# Patient Record
Sex: Female | Born: 1990 | Race: Black or African American | Hispanic: No | Marital: Single | Smoking: Never smoker
Health system: Southern US, Community
[De-identification: ages and names within clinical notes are randomized; demographics above are authoritative.]

## PROBLEM LIST (undated history)

## (undated) ENCOUNTER — Emergency Department (HOSPITAL_COMMUNITY): Discharge status: Against Medical Advice | Payer: PRIVATE HEALTH INSURANCE

## (undated) DIAGNOSIS — M419 Scoliosis, unspecified: Secondary | ICD-10-CM

## (undated) DIAGNOSIS — H409 Unspecified glaucoma: Secondary | ICD-10-CM

## (undated) DIAGNOSIS — N83209 Unspecified ovarian cyst, unspecified side: Secondary | ICD-10-CM

## (undated) DIAGNOSIS — D573 Sickle-cell trait: Secondary | ICD-10-CM

## (undated) DIAGNOSIS — I341 Nonrheumatic mitral (valve) prolapse: Secondary | ICD-10-CM

---
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
## 2011-04-14 ENCOUNTER — Emergency Department (HOSPITAL_COMMUNITY)
Admission: EM | Admit: 2011-04-14 | Discharge: 2011-04-15 | Disposition: A | Discharge status: Home / Self Care | Payer: PRIVATE HEALTH INSURANCE | Attending: Emergency Medicine | Admitting: Emergency Medicine

## 2011-04-14 ENCOUNTER — Encounter (HOSPITAL_COMMUNITY): Payer: Self-pay | Admitting: Emergency Medicine

## 2011-04-14 DIAGNOSIS — D573 Sickle-cell trait: Secondary | ICD-10-CM | POA: Insufficient documentation

## 2011-04-14 DIAGNOSIS — N949 Unspecified condition associated with female genital organs and menstrual cycle: Secondary | ICD-10-CM | POA: Insufficient documentation

## 2011-04-14 DIAGNOSIS — R102 Pelvic and perineal pain: Secondary | ICD-10-CM

## 2011-04-14 DIAGNOSIS — R109 Unspecified abdominal pain: Secondary | ICD-10-CM | POA: Insufficient documentation

## 2011-04-14 HISTORY — DX: Sickle-cell trait: D57.3

## 2011-04-14 HISTORY — DX: Scoliosis, unspecified: M41.9

## 2011-04-14 HISTORY — DX: Nonrheumatic mitral (valve) prolapse: I34.1

## 2011-04-14 NOTE — ED Notes (Signed)
Pt states this morning she started having pain in her right lower quadrant that is sharp in nature  Pt states she has a hx of ovarian cysts  Pt is c/o nausea without vomiting

## 2011-04-15 ENCOUNTER — Emergency Department (HOSPITAL_COMMUNITY): Payer: PRIVATE HEALTH INSURANCE

## 2011-04-15 LAB — CBC
HCT: 36.7 % (ref 36.0–46.0)
MCHC: 33.8 g/dL (ref 30.0–36.0)
MCV: 83.2 fL (ref 78.0–100.0)
Platelets: 319 10*3/uL (ref 150–400)
RDW: 13.7 % (ref 11.5–15.5)
WBC: 8.3 10*3/uL (ref 4.0–10.5)

## 2011-04-15 LAB — POCT I-STAT, CHEM 8
Calcium, Ion: 1.24 mmol/L (ref 1.12–1.32)
HCT: 40 % (ref 36.0–46.0)
Hemoglobin: 13.6 g/dL (ref 12.0–15.0)
Sodium: 141 mEq/L (ref 135–145)
TCO2: 25 mmol/L (ref 0–100)

## 2011-04-15 LAB — URINALYSIS, ROUTINE W REFLEX MICROSCOPIC
Glucose, UA: NEGATIVE mg/dL
Hgb urine dipstick: NEGATIVE
Ketones, ur: NEGATIVE mg/dL
Protein, ur: NEGATIVE mg/dL
Urobilinogen, UA: 0.2 mg/dL (ref 0.0–1.0)

## 2011-04-15 LAB — DIFFERENTIAL
Basophils Absolute: 0 10*3/uL (ref 0.0–0.1)
Basophils Relative: 1 % (ref 0–1)
Eosinophils Absolute: 0.4 10*3/uL (ref 0.0–0.7)
Eosinophils Relative: 4 % (ref 0–5)
Monocytes Absolute: 0.4 10*3/uL (ref 0.1–1.0)

## 2011-04-15 MED ORDER — ONDANSETRON HCL 4 MG/2ML IJ SOLN
4.0000 mg | Freq: Once | INTRAMUSCULAR | Status: AC
  Administered 2011-04-15: 4 mg via INTRAVENOUS
  Filled 2011-04-15: qty 2

## 2011-04-15 MED ORDER — FENTANYL CITRATE 0.05 MG/ML IJ SOLN
50.0000 ug | Freq: Once | INTRAMUSCULAR | Status: AC
  Administered 2011-04-15: 50 ug via INTRAVENOUS
  Filled 2011-04-15: qty 2

## 2011-04-15 MED ORDER — HYDROCODONE-ACETAMINOPHEN 5-500 MG PO TABS: 2.0000 | ORAL_TABLET | Freq: Four times a day (QID) | ORAL | Status: AC | PRN

## 2011-04-15 NOTE — ED Notes (Signed)
Patient c/o intermittent RLQ ABD pain that began yesterday morning accompanied with nausea.

## 2011-04-15 NOTE — ED Notes (Signed)
Patient transported to Ultrasound 

## 2011-04-15 NOTE — ED Notes (Signed)
Pt. IV was taken out. Pt. Requested to have something to eat. Nurse was notified. Pt. Got sandwich/fruit cup, cup of water.

## 2011-04-15 NOTE — ED Provider Notes (Signed)
History     CSN: [id number]  Arrival date & time 04/14/11  1947   First MD Initiated Contact with Patient 04/15/11 0133      Chief Complaint  Patient presents with  . Abdominal Pain    (Consider location/radiation/quality/duration/timing/severity/associated sxs/prior treatment) Patient is a 21 y.o. female presenting with abdominal pain. The history is provided by the patient. No language interpreter was used.  Abdominal Pain The primary symptoms of the illness include abdominal pain. The primary symptoms of the illness do not include fever, fatigue, shortness of breath, nausea, vomiting, diarrhea, hematemesis, hematochezia, dysuria, vaginal discharge or vaginal bleeding. The current episode started 13 to 24 hours ago. The onset of the illness was sudden. The problem has not changed (episodic like previous ovarian cysts) since onset. The abdominal pain is located in the suprapubic region. The abdominal pain does not radiate. The severity of the abdominal pain is 9/10. The abdominal pain is relieved by nothing. Exacerbated by: nothing.  Associated with: none. The patient states that she believes she is currently pregnant. The patient has not had a change in bowel habit. Symptoms associated with the illness do not include chills, anorexia, diaphoresis, heartburn, constipation, urgency, hematuria, frequency or back pain. Significant associated medical issues do not include HIV.    Past Medical History  Diagnosis Date  . Mitral valve prolapse   . Scoliosis   . Sickle cell trait     History reviewed. No pertinent past surgical history.  Family History  Problem Relation Age of Onset  . Hypertension Mother   . Diabetes Other   . Hypertension Other   . Cancer Other     History  Substance Use Topics  . Smoking status: Never Smoker   . Smokeless tobacco: Not on file  . Alcohol Use: No    OB History    Grav Para Term Preterm Abortions TAB SAB Ect Mult Living                   Review of Systems  Constitutional: Negative for fever, chills, diaphoresis and fatigue.  HENT: Negative.   Eyes: Negative.   Respiratory: Negative for shortness of breath.   Cardiovascular: Negative.   Gastrointestinal: Positive for abdominal pain. Negative for heartburn, nausea, vomiting, diarrhea, constipation, hematochezia, anorexia and hematemesis.  Genitourinary: Positive for pelvic pain. Negative for dysuria, urgency, frequency, hematuria, vaginal bleeding and vaginal discharge.  Musculoskeletal: Negative for back pain.  Skin: Negative.   Neurological: Negative.   Hematological: Negative.   Psychiatric/Behavioral: Negative.     Allergies  Review of patient's allergies indicates no known allergies.  Home Medications   Current Outpatient Rx  Name Route Sig Dispense Refill  . ADULT MULTIVITAMIN W/MINERALS CH Oral Take 1 tablet by mouth daily.    Marland Kitchen VITAMIN C 500 MG PO TABS Oral Take 500 mg by mouth daily.      BP 126/90  Pulse 63  Temp(Src) 99.1 F (37.3 C) (Oral)  Resp 16  SpO2 100%  LMP 03/25/2011  Physical Exam  Constitutional: She is oriented to person, place, and time. She appears well-developed and well-nourished. No distress.  HENT:  Head: Normocephalic and atraumatic.  Eyes: Conjunctivae are normal. Pupils are equal, round, and reactive to light.  Neck: Normal range of motion. Neck supple.  Cardiovascular: Normal rate and regular rhythm.   Pulmonary/Chest: Effort normal and breath sounds normal.  Abdominal: Soft. Bowel sounds are normal. There is no tenderness. There is no rebound and no guarding.  Musculoskeletal: Normal range of motion.  Neurological: She is alert and oriented to person, place, and time.  Skin: Skin is warm and dry.  Psychiatric: Thought content normal.    ED Course  Procedures (including critical care time)   Labs Reviewed  URINALYSIS, ROUTINE W REFLEX MICROSCOPIC  CBC  DIFFERENTIAL  POCT I-STAT, CHEM 8   No results  found.   No diagnosis found.    MDM  No indication for CT scan no white count no left shift, like previous pain Return for worsening pain vomiting diarrhea or any concerns.  Patient verbalizes understanding and agrees to follow up       Wesleigh Markovic Smitty Cords, MD 04/15/11 [phone]

## 2011-04-15 NOTE — ED Notes (Addendum)
Error in charting.

## 2011-04-15 NOTE — ED Notes (Signed)
Received report on patient, she is alert and oriented skin W&D Pt was explained to her about what is going on and why she is still here and she verbalized understanding.

## 2011-04-23 ENCOUNTER — Emergency Department (HOSPITAL_COMMUNITY)
Admission: EM | Admit: 2011-04-23 | Discharge: 2011-04-23 | Disposition: A | Discharge status: Home / Self Care | Payer: PRIVATE HEALTH INSURANCE | Attending: Emergency Medicine | Admitting: Emergency Medicine

## 2011-04-23 ENCOUNTER — Encounter (HOSPITAL_COMMUNITY): Payer: Self-pay | Admitting: Emergency Medicine

## 2011-04-23 DIAGNOSIS — R10813 Right lower quadrant abdominal tenderness: Secondary | ICD-10-CM | POA: Insufficient documentation

## 2011-04-23 DIAGNOSIS — N949 Unspecified condition associated with female genital organs and menstrual cycle: Secondary | ICD-10-CM | POA: Insufficient documentation

## 2011-04-23 DIAGNOSIS — D573 Sickle-cell trait: Secondary | ICD-10-CM | POA: Insufficient documentation

## 2011-04-23 DIAGNOSIS — R102 Pelvic and perineal pain: Secondary | ICD-10-CM

## 2011-04-23 HISTORY — DX: Unspecified ovarian cyst, unspecified side: N83.209

## 2011-04-23 LAB — URINALYSIS, ROUTINE W REFLEX MICROSCOPIC
Glucose, UA: NEGATIVE mg/dL
Leukocytes, UA: NEGATIVE
Specific Gravity, Urine: 1.022 (ref 1.005–1.030)
pH: 5.5 (ref 5.0–8.0)

## 2011-04-23 MED ORDER — KETOROLAC TROMETHAMINE 60 MG/2ML IM SOLN
60.0000 mg | Freq: Once | INTRAMUSCULAR | Status: AC
  Administered 2011-04-23: 60 mg via INTRAMUSCULAR
  Filled 2011-04-23: qty 2

## 2011-04-23 MED ORDER — IPRATROPIUM BROMIDE 0.02 % IN SOLN
RESPIRATORY_TRACT | Status: AC
  Filled 2011-04-23: qty 2.5

## 2011-04-23 MED ORDER — ONDANSETRON 8 MG PO TBDP
8.0000 mg | ORAL_TABLET | Freq: Once | ORAL | Status: AC
  Administered 2011-04-23: 8 mg via ORAL
  Filled 2011-04-23: qty 1

## 2011-04-23 MED ORDER — ONDANSETRON HCL 4 MG PO TABS: 4.0000 mg | ORAL_TABLET | Freq: Four times a day (QID) | ORAL | Status: AC

## 2011-04-23 MED ORDER — TRAMADOL HCL 50 MG PO TABS
100.0000 mg | ORAL_TABLET | Freq: Once | ORAL | Status: AC
  Administered 2011-04-23: 100 mg via ORAL
  Filled 2011-04-23: qty 2

## 2011-04-23 NOTE — Discharge Instructions (Signed)
Drink plenty of fluids. Take your pain medicine you have at home. Use the  Zofran for nausea or vomiting. Consider getting a gynecologist in [place] for your continuing pelvic pain. Reach turned to the ER if you get fever, persistent vomiting, or the pain gets severe. Otherwise if you continue to have discomfort you can also go to [facility] to be evaluated. You would go to Maternity admissions to be seen.

## 2011-04-23 NOTE — ED Notes (Signed)
Rt lower quad abd pain with emesis x2, n/v/d, no fever , cough sice last week. Was seen last week for this same thing. Sore throat since last week, lmp 03/28/2011

## 2011-04-23 NOTE — ED Provider Notes (Signed)
History     CSN: [id number]  Arrival date & time 04/23/11  1549   First MD Initiated Contact with Patient 04/23/11 1552      Chief Complaint  Patient presents with  . Abdominal Pain  . Emesis    (Consider location/radiation/quality/duration/timing/severity/associated sxs/prior treatment) HPI  Patient relates she's been having pelvic pain off and on since 2011. She was seen by her GYN in [place] who diagnosed her ovarian cyst and told her she did not need to be concerned unless they were greater than 2 cm in size. She relates her last normal period was January 22 so her period was due yesterday. She relates about 3 AM she awakened with sharp right lower quadrant pain. She started having vomiting about 9:30 and vomited twice. She's also had diarrhea once. She denies fever. She states she's had a cough with rhinorrhea and sore throat for a week. She denies dysuria, frequency, or vaginal discharge. She states walking makes the pain feel worse and bending over makes it feel better. Patient was seen in the ER on 212 similar symptoms he had a ultrasound of pelvis done which was normal.  [facility] [place]  Past Medical History  Diagnosis Date  . Mitral valve prolapse   . Scoliosis   . Sickle cell trait   . Ovarian cyst     History reviewed. No pertinent past surgical history.  Family History  Problem Relation Age of Onset  . Hypertension Mother   . Diabetes Other   . Hypertension Other   . Cancer Other     History  Substance Use Topics  . Smoking status: Never Smoker   . Smokeless tobacco: Not on file  . Alcohol Use: No   college student Not sexually active  OB History    Grav Para Term Preterm Abortions TAB SAB Ect Mult Living                  Review of Systems  All other systems reviewed and are negative.    Allergies  Review of patient's allergies indicates no known allergies.  Home Medications   Current Outpatient Rx  Name Route Sig Dispense Refill  .  AZITHROMYCIN 250 MG PO TABS Oral Take 250 mg by mouth daily.    Marland Kitchen HYDROCODONE-ACETAMINOPHEN 5-500 MG PO TABS Oral Take 2 tablets by mouth every 6 (six) hours as needed for pain. 10 tablet 0  . ADULT MULTIVITAMIN W/MINERALS CH Oral Take 1 tablet by mouth daily.    Marland Kitchen VITAMIN C 500 MG PO TABS Oral Take 500 mg by mouth daily.      BP 106/67  Pulse 97  Temp 98 F (36.7 C)  Resp 20  SpO2 100%  LMP 03/25/2011  Vital signs normal    Physical Exam  Constitutional: She is oriented to person, place, and time. She appears well-developed and well-nourished.  Non-toxic appearance. She does not appear ill. No distress.  HENT:  Head: Normocephalic and atraumatic.  Right Ear: External ear normal.  Left Ear: External ear normal.  Nose: Nose normal. No mucosal edema or rhinorrhea.  Mouth/Throat: Oropharynx is clear and moist and mucous membranes are normal. No dental abscesses or uvula swelling.  Eyes: Conjunctivae and EOM are normal. Pupils are equal, round, and reactive to light.  Neck: Normal range of motion and full passive range of motion without pain. Neck supple.  Cardiovascular: Normal rate, regular rhythm and normal heart sounds.  Exam reveals no gallop and no friction  rub.   No murmur heard. Pulmonary/Chest: Effort normal and breath sounds normal. No respiratory distress. She has no wheezes. She has no rhonchi. She has no rales. She exhibits no tenderness and no crepitus.  Abdominal: Soft. Normal appearance and bowel sounds are normal. She exhibits no distension. There is tenderness. There is no rebound and no guarding.       Minimal tenderness in the right lower quadrant no guarding or rebound noted  Musculoskeletal: Normal range of motion. She exhibits no edema and no tenderness.       Moves all extremities well.   Neurological: She is alert and oriented to person, place, and time. She has normal strength. No cranial nerve deficit.  Skin: Skin is warm, dry and intact. No rash noted. No  erythema. No pallor.  Psychiatric: She has a normal mood and affect. Her speech is normal and behavior is normal. Her mood appears not anxious.    ED Course  Procedures (including critical care time)  Pt given zofran and her nausea is better. Pt also given toradol IM and states her pain isn't better. She is given tramadol orally and she has norco at home alreay from her prior ED visit.   [place] reported verified as normal on 2/12.   Diagnoses that have been ruled out:  None  Diagnoses that are still under consideration:  None  Final diagnoses:  Pelvic pain    1. Pelvic pain    New Prescriptions   ONDANSETRON (ZOFRAN) 4 MG TABLET    Take 1 tablet (4 mg total) by mouth every 6 (six) hours.   Plan discharge Devoria Albe, MD, FACEP    MDM          Ward Givens, MD 04/23/11 [phone]

## 2011-05-13 ENCOUNTER — Encounter (HOSPITAL_COMMUNITY): Payer: Self-pay | Admitting: *Deleted

## 2011-05-13 ENCOUNTER — Emergency Department (HOSPITAL_COMMUNITY): Payer: PRIVATE HEALTH INSURANCE

## 2011-05-13 ENCOUNTER — Emergency Department (HOSPITAL_COMMUNITY)
Admission: EM | Admit: 2011-05-13 | Discharge: 2011-05-13 | Disposition: A | Discharge status: Home / Self Care | Payer: PRIVATE HEALTH INSURANCE | Attending: Emergency Medicine | Admitting: Emergency Medicine

## 2011-05-13 DIAGNOSIS — IMO0002 Reserved for concepts with insufficient information to code with codable children: Secondary | ICD-10-CM | POA: Insufficient documentation

## 2011-05-13 DIAGNOSIS — W19XXXA Unspecified fall, initial encounter: Secondary | ICD-10-CM | POA: Insufficient documentation

## 2011-05-13 DIAGNOSIS — I059 Rheumatic mitral valve disease, unspecified: Secondary | ICD-10-CM | POA: Insufficient documentation

## 2011-05-13 DIAGNOSIS — S79911A Unspecified injury of right hip, initial encounter: Secondary | ICD-10-CM

## 2011-05-13 DIAGNOSIS — D573 Sickle-cell trait: Secondary | ICD-10-CM | POA: Insufficient documentation

## 2011-05-13 DIAGNOSIS — T148XXA Other injury of unspecified body region, initial encounter: Secondary | ICD-10-CM

## 2011-05-13 DIAGNOSIS — M4120 Other idiopathic scoliosis, site unspecified: Secondary | ICD-10-CM | POA: Insufficient documentation

## 2011-05-13 NOTE — ED Notes (Signed)
Pt in s/p fall on Saturday, c/o abrasion to right hip, no bleeding noted, pt ambulating without difficulty

## 2011-05-13 NOTE — Discharge Instructions (Signed)
Abrasions Abrasions are skin scrapes. Their treatment depends on how large and deep the abrasion is. Abrasions do not extend through all layers of the skin. A cut or lesion through all skin layers is called a laceration. HOME CARE INSTRUCTIONS   If you were given a dressing, change it at least once a day or as instructed by your caregiver. If the bandage sticks, soak it off with a solution of water or hydrogen peroxide.   Twice a day, wash the area with soap and water to remove all the cream/ointment. You may do this in a sink, under a tub faucet, or in a shower. Rinse off the soap and pat dry with a clean towel. Look for signs of infection (see below).   Reapply cream/ointment according to your caregiver's instruction. This will help prevent infection and keep the bandage from sticking. Telfa or gauze over the wound and under the dressing or wrap will also help keep the bandage from sticking.   If the bandage becomes wet, dirty, or develops a foul smell, change it as soon as possible.   Only take over-the-counter or prescription medicines for pain, discomfort, or fever as directed by your caregiver.  SEEK IMMEDIATE MEDICAL CARE IF:   Increasing pain in the wound.   Signs of infection develop: redness, swelling, surrounding area is tender to touch, or pus coming from the wound.   You have a fever.   Any foul smell coming from the wound or dressing.  Most skin wounds heal within ten days. Facial wounds heal faster. However, an infection may occur despite proper treatment. You should have the wound checked for signs of infection within 24 to 48 hours or sooner if problems arise. If you were not given a wound-check appointment, look closely at the wound yourself on the second day for early signs of infection listed above. MAKE SURE YOU:   Understand these instructions.   Will watch your condition.   Will get help right away if you are not doing well or get worse.  Document Released:  11/27/2004 Document Revised: 02/06/2011 Document Reviewed: 01/21/2011 [employer] Patient Information 2012 [employer]. 

## 2011-05-13 NOTE — ED Provider Notes (Signed)
History     CSN: [id number]  Arrival date & time 05/13/11  1750   First MD Initiated Contact with Patient 05/13/11 1901      Chief Complaint  Patient presents with  . Abrasion    RT hip  . Hip Pain    RT hip.  Fell on Saturday.  Pt is ambulatory.    (Consider location/radiation/quality/duration/timing/severity/associated sxs/prior treatment) HPI  21 year old female presents with a chief complaint of right hip pain. Patient states a few days ago she fell and landed on her right hip. She did hits her head but denies any loss of consciousness. She noticed abrasions to the right hip, right forearm, and left knee. For the past few days she noticed continuous pain to the right hip and greenish discharge from abrasion site. She is able to ambulate but states pain worse in. She denies bleeding. She request for x-ray.  Past Medical History  Diagnosis Date  . Mitral valve prolapse   . Scoliosis   . Sickle cell trait   . Ovarian cyst     History reviewed. No pertinent past surgical history.  Family History  Problem Relation Age of Onset  . Hypertension Mother   . Diabetes Other   . Hypertension Other   . Cancer Other     History  Substance Use Topics  . Smoking status: Never Smoker   . Smokeless tobacco: Not on file  . Alcohol Use: No    OB History    Grav Para Term Preterm Abortions TAB SAB Ect Mult Living                  Review of Systems  All other systems reviewed and are negative.    Allergies  Review of patient's allergies indicates no known allergies.  Home Medications   Current Outpatient Rx  Name Route Sig Dispense Refill  . ACETAMINOPHEN 325 MG PO TABS Oral Take 650 mg by mouth every 6 (six) hours as needed. Pain.    Marland Kitchen AZITHROMYCIN 250 MG PO TABS Oral Take 250 mg by mouth daily.    Marland Kitchen HYDROCODONE-ACETAMINOPHEN 5-325 MG PO TABS Oral Take 1 tablet by mouth every 6 (six) hours as needed. Pain.    . ADULT MULTIVITAMIN W/MINERALS CH Oral Take 1 tablet by  mouth daily.      BP 114/68  Pulse 77  Temp 98.2 F (36.8 C)  Resp 20  SpO2 100%  LMP 04/28/2011  Physical Exam  Nursing note and vitals reviewed. Constitutional: She appears well-nourished.  HENT:  Head: Normocephalic and atraumatic.  Eyes: Conjunctivae are normal.  Neck: Normal range of motion. Neck supple.  Musculoskeletal: Normal range of motion.  Neurological: She is alert.  Skin: Skin is warm.     Psychiatric: She has a normal mood and affect.    ED Course  Procedures (including critical care time)  Labs Reviewed - No data to display No results found.   Dg Hip Complete Right  05/13/2011  *RADIOLOGY REPORT*  Clinical Data: Fall  RIGHT HIP - COMPLETE 2+ VIEW  Comparison: None.  Findings: No acute fracture and no dislocation.  Unremarkable soft tissues.  IMPRESSION: No acute bony pathology.  Original Report Authenticated By: Donavan Burnet, M.D.       MDM  Abrasion noted to R hip, with no obvious sign of infection.  No hip deformity, able to ambulate without difficulty.  Pt request for xray, will obtain xray.     8:37 PM Xray of R  hip shows no acute fx or dislocation, reassurance given.  Bacitracin or neosporin for wound care.      Fayrene Helper, PA-C 05/13/11 2038

## 2011-05-14 NOTE — ED Provider Notes (Signed)
Medical screening examination/treatment/procedure(s) were performed by non-physician practitioner and as supervising physician I was immediately available for consultation/collaboration.  Cyndra Numbers, MD 05/14/11 [phone]

## 2013-01-10 ENCOUNTER — Encounter (HOSPITAL_COMMUNITY): Payer: Self-pay | Admitting: Emergency Medicine

## 2013-01-10 ENCOUNTER — Emergency Department (HOSPITAL_COMMUNITY)
Admission: EM | Admit: 2013-01-10 | Discharge: 2013-01-10 | Disposition: A | Discharge status: Home / Self Care | Payer: No Typology Code available for payment source | Attending: Emergency Medicine | Admitting: Emergency Medicine

## 2013-01-10 DIAGNOSIS — Z8679 Personal history of other diseases of the circulatory system: Secondary | ICD-10-CM | POA: Insufficient documentation

## 2013-01-10 DIAGNOSIS — H40819 Glaucoma with increased episcleral venous pressure, unspecified eye: Secondary | ICD-10-CM | POA: Insufficient documentation

## 2013-01-10 DIAGNOSIS — Z8739 Personal history of other diseases of the musculoskeletal system and connective tissue: Secondary | ICD-10-CM | POA: Insufficient documentation

## 2013-01-10 DIAGNOSIS — Z79899 Other long term (current) drug therapy: Secondary | ICD-10-CM | POA: Insufficient documentation

## 2013-01-10 DIAGNOSIS — Z8742 Personal history of other diseases of the female genital tract: Secondary | ICD-10-CM | POA: Insufficient documentation

## 2013-01-10 DIAGNOSIS — Z862 Personal history of diseases of the blood and blood-forming organs and certain disorders involving the immune mechanism: Secondary | ICD-10-CM | POA: Insufficient documentation

## 2013-01-10 DIAGNOSIS — R1110 Vomiting, unspecified: Secondary | ICD-10-CM | POA: Insufficient documentation

## 2013-01-10 DIAGNOSIS — R51 Headache: Secondary | ICD-10-CM | POA: Insufficient documentation

## 2013-01-10 DIAGNOSIS — H40051 Ocular hypertension, right eye: Secondary | ICD-10-CM

## 2013-01-10 HISTORY — DX: Unspecified glaucoma: H40.9

## 2013-01-10 MED ORDER — TETRACAINE HCL 0.5 % OP SOLN
1.0000 [drp] | Freq: Once | OPHTHALMIC | Status: AC
  Administered 2013-01-10: 1 [drp] via OPHTHALMIC
  Filled 2013-01-10: qty 2

## 2013-01-10 NOTE — ED Notes (Signed)
Pt c/o headache intermittently for 2 weeks. Pt has never been diagnosed with Migraines. Denies double vision. Pt c/o light sensitivity. Pt sts using her eye drops usually helps at first but then it fades. NAD noted, A&Ox4.

## 2013-01-10 NOTE — ED Provider Notes (Signed)
CSN: [id number]     Arrival date & time 01/10/13  1942 History   First MD Initiated Contact with Patient 01/10/13 2111   This chart was scribed for Antony Madura PA-C, a non-physician practitioner working with Benny Lennert, MD by Lewanda Rife, ED Scribe. This patient was seen in room WTR7/WTR7 and the patient's care was started at 9:56 PM    Chief Complaint  Patient presents with  . Migraine   (Consider location/radiation/quality/duration/timing/severity/associated sxs/prior Treatment) The history is provided by the patient. No language interpreter was used.   HPI Comments: Mallory Dickerson is a 22 y.o. female who presents to the Emergency Department with PMHx of Glaucoma complaining of waxing and waning right temporal and occipital headaches onset 7 days. Describes pain as sharp and squeezing. Denies headache at this time. Reports associated 1 non-bloody emesis. Denies associated nausea, numbness, visual disturbances, fever, vision loss, difficulty speaking or swallowing, and weakness. Reports headaches are exacerbated by sunlight and when she forgets to use her eye drops. headaches are usually alleviated when using eye drops.  States she followed up with eye doctor in [place] October this year. States she uses latanoprost for Glaucoma.    Past Medical History  Diagnosis Date  . Mitral valve prolapse   . Scoliosis   . Sickle cell trait   . Ovarian cyst   . Glaucoma    History reviewed. No pertinent past surgical history. Family History  Problem Relation Age of Onset  . Hypertension Mother   . Diabetes Other   . Hypertension Other   . Cancer Other    History  Substance Use Topics  . Smoking status: Never Smoker   . Smokeless tobacco: Not on file  . Alcohol Use: Yes     Comment: occ   OB History   Grav Para Term Preterm Abortions TAB SAB Ect Mult Living                 Review of Systems  Eyes: Positive for pain. Negative for visual disturbance.  Neurological: Positive for  headaches.  All other systems reviewed and are negative.   A complete 10 system review of systems was obtained and all systems are negative except as noted in the HPI and PMHx.   Allergies  Review of patient's allergies indicates no known allergies.  Home Medications   Current Outpatient Rx  Name  Route  Sig  Dispense  Refill  . latanoprost (XALATAN) 0.005 % ophthalmic solution   Both Eyes   Place 1 drop into both eyes at bedtime.         . Multiple Vitamin (MULITIVITAMIN WITH MINERALS) TABS   Oral   Take 1 tablet by mouth daily.          BP 115/87  Pulse 69  Temp(Src) 98.6 F (37 C) (Oral)  Resp 16  Ht 5\' 3"  (1.6 m)  Wt 107 lb (48.535 kg)  BMI 18.96 kg/m2  SpO2 98%  LMP 12/26/2012  Physical Exam  Nursing note and vitals reviewed. Constitutional: She is oriented to person, place, and time. She appears well-developed and well-nourished. No distress.  HENT:  Head: Normocephalic and atraumatic.  Mouth/Throat: Oropharynx is clear and moist. No oropharyngeal exudate.  Eyes: Conjunctivae and EOM are normal. Pupils are equal, round, and reactive to light. Right eye exhibits no discharge. Left eye exhibits no discharge. No scleral icterus. Right eye exhibits no nystagmus. Left eye exhibits no nystagmus.  Fundoscopic exam:      The right  eye shows no hemorrhage and no papilledema. The right eye shows red reflex.       The left eye shows no hemorrhage and no papilledema. The left eye shows red reflex.  IOP 26 on right eye  IOP 20 on left eye  Normal cup-to-disc-ratio bilaterally   Neck: Normal range of motion. Neck supple.  Cardiovascular: Normal rate, regular rhythm, normal heart sounds and intact distal pulses.   No murmur heard. Pulmonary/Chest: Effort normal and breath sounds normal. No respiratory distress. She has no wheezes. She has no rales.  Musculoskeletal: Normal range of motion.  Neurological: She is alert and oriented to person, place, and time. GCS eye  subscore is 4. GCS verbal subscore is 5. GCS motor subscore is 6.  Patient speaks in full sentences. Cranial nerves III through XII grossly intact. Patient has equal grip strength bilaterally with 5/5 strength against resistance in her upper and lower extremities. DTRs normal and symmetric. Patient moves extremities without ataxia.  Skin: Skin is warm and dry. No rash noted. She is not diaphoretic. No erythema. No pallor.  Psychiatric: She has a normal mood and affect. Her behavior is normal.    ED Course  Procedures (including critical care time)  COORDINATION OF CARE:  Nursing notes reviewed. Vital signs reviewed. Initial pt interview and examination performed.   9:56 PM-Discussed work up plan with pt at bedside, which includes tono-pen. Pt agrees with plan to f/u with local optha.  Treatment plan initiated: Medications  tetracaine (PONTOCAINE) 0.5 % ophthalmic solution 1 drop (not administered)   Initial diagnostic testing ordered.    Labs Review Labs Reviewed - No data to display Imaging Review No results found.  EKG Interpretation   None       MDM   1. Increased intraocular pressure, right    22 year old female a history of glaucoma presents for a headache that has been intermittent over the past month. Patient associates timing of her headache with forgetting to use her latanoprost eye drops. Patient without headache on presentation to ED. She is afebrile, nontoxic appearing, and hemodynamically stable. No nuchal rigidity or meningeal signs. No focal neurologic deficits appreciated. Patient does have slightly increased intraocular pressure of her right eye, but she states she has not used her drops you today and that her pressures this high as 28 when she forgets to use her eye drops. No papilledema or intraocular hemorrhage appreciated bilaterally. Patient also denying acute vision changes with symptoms such as vision loss, blurry vision, and floaters.  Patient stable and  appropriate for discharge with ophthalmology followup for further evaluation of her symptoms. Return precautions discussed and patient agreeable to plan with no unaddressed concerns.  I personally performed the services described in this documentation, which was scribed in my presence. The recorded information has been reviewed and is accurate.     Antony Madura, PA-C 01/11/13 [phone]

## 2013-01-10 NOTE — ED Notes (Signed)
Pt states she has been having headaches a lot lately esp if she is in the sun a lot or if she is having to strain her eyes  Pt states she has glaucoma and is wondering if they are related to that  Pt states she had vomiting maybe on Saturday but none since  Pt states she has not taken any medication for her headache

## 2013-01-10 NOTE — ED Notes (Signed)
Pt states the pain comes and goes and she denies pain at this time

## 2013-01-12 NOTE — ED Provider Notes (Signed)
Medical screening examination/treatment/procedure(s) were performed by non-physician practitioner and as supervising physician I was immediately available for consultation/collaboration.  EKG Interpretation   None         Latima Hamza L Revin Corker, MD 01/12/13 0949 

## 2013-05-23 IMAGING — US US TRANSVAGINAL NON-OB
1 series · 14 of 25 positions shown · non-contrast
Comparison: None.

CLINICAL DATA: Abdominal pain.



[Series 1: us transvaginal non-ob · 0.30mm/px · 14 of 30 slices shown]
[im 1/30]
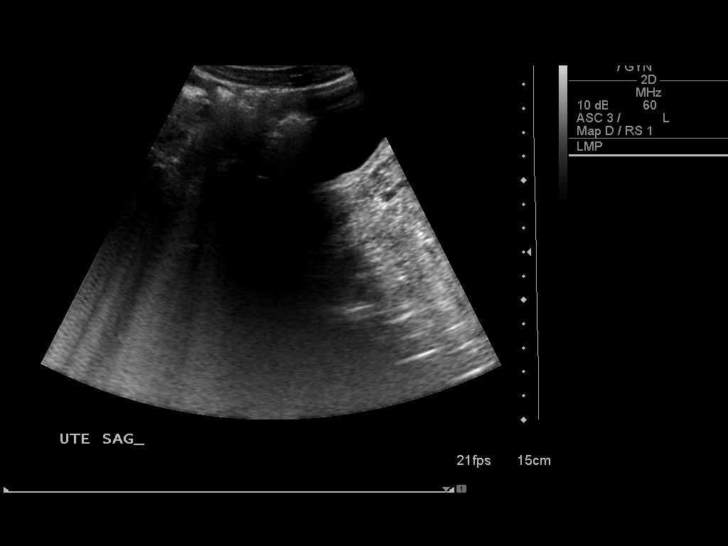
[im 3/30]
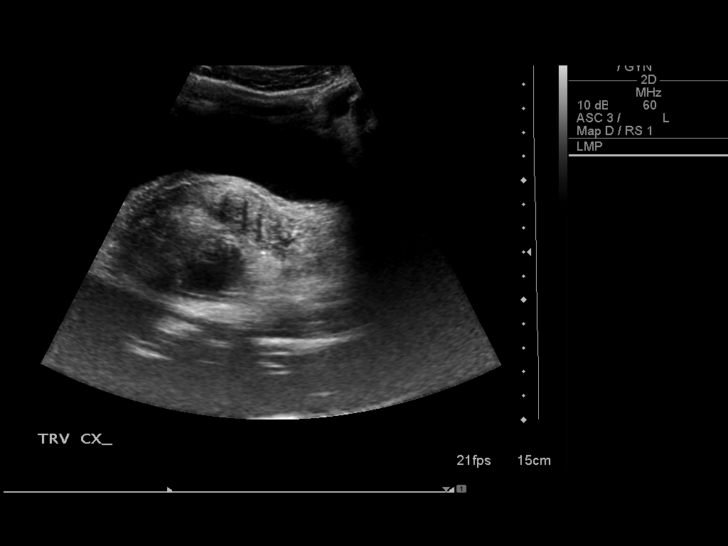
[im 5/30]
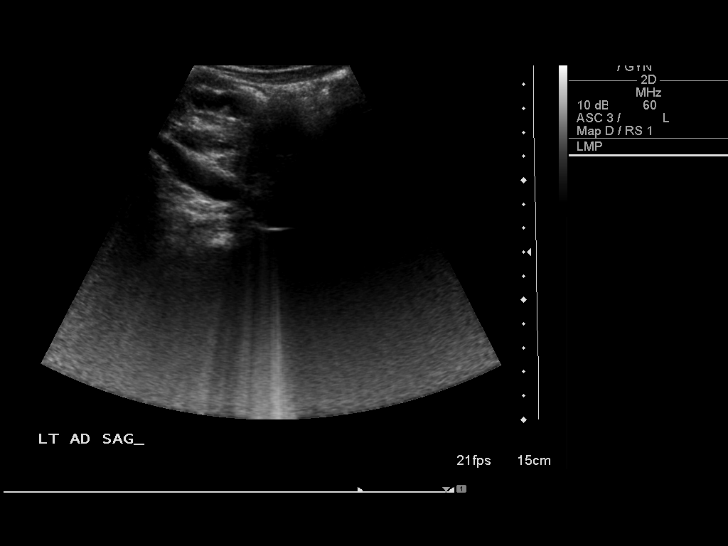
[im 8/30]
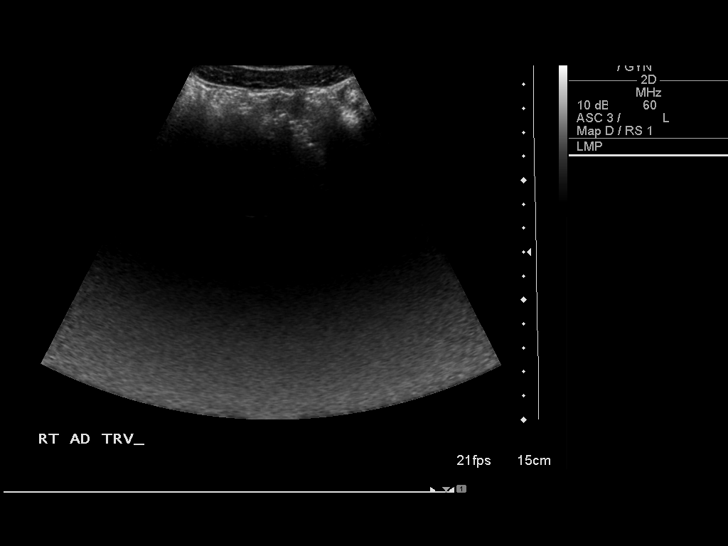
[im 10/30]
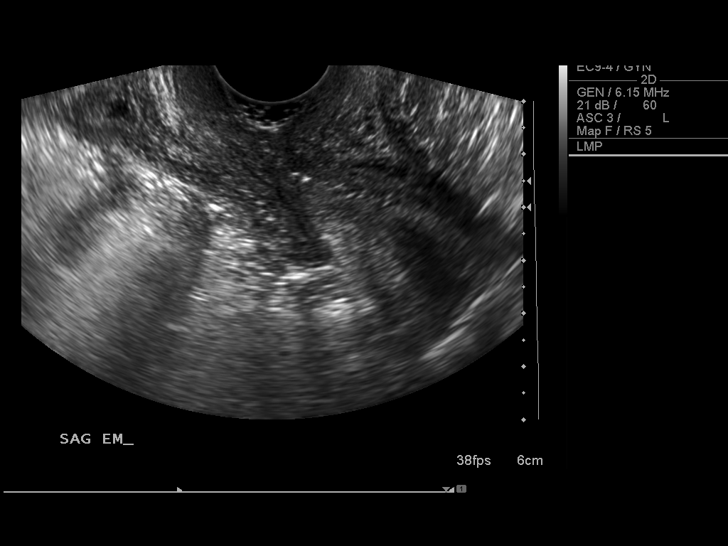
[im 11/30]
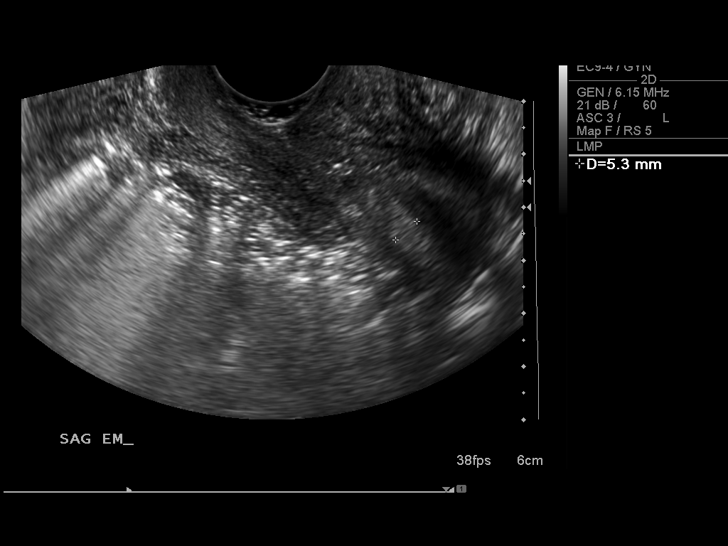
[im 14/30]
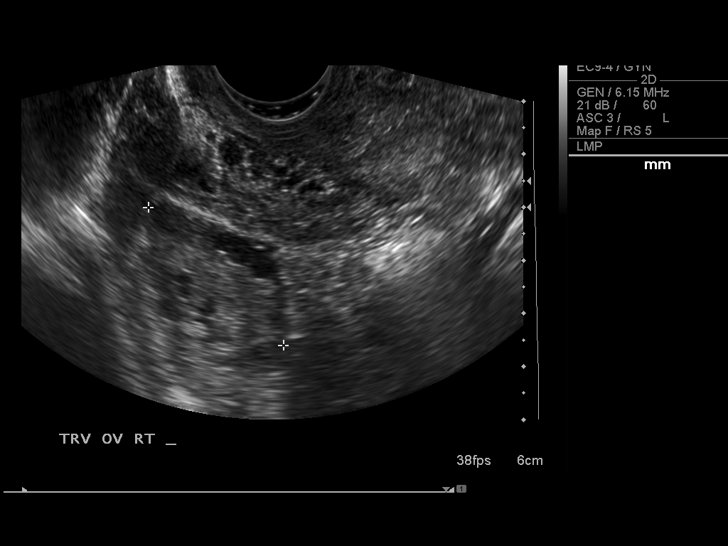
[im 16/30]
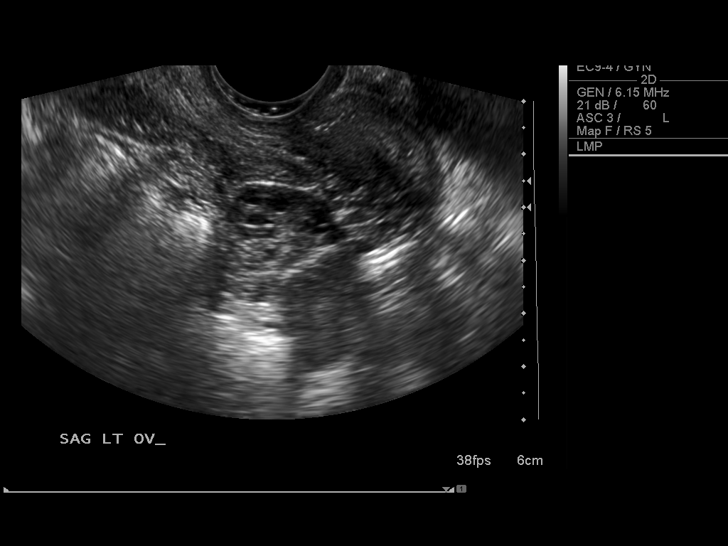
[im 19/30]
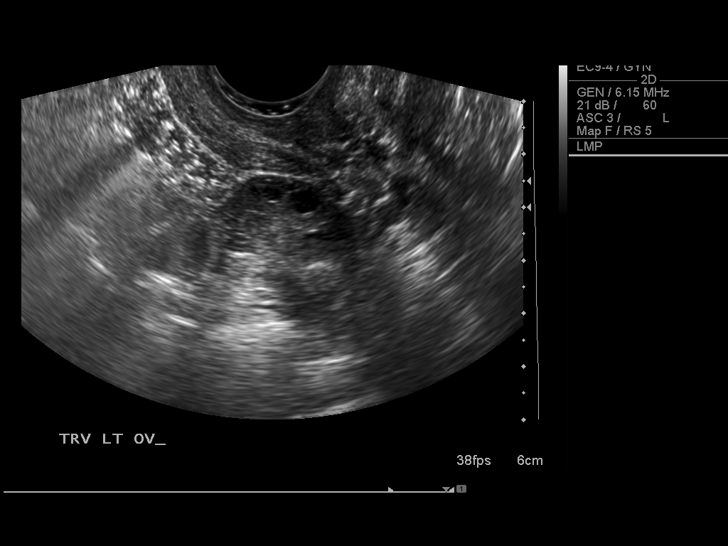
[im 20/30]
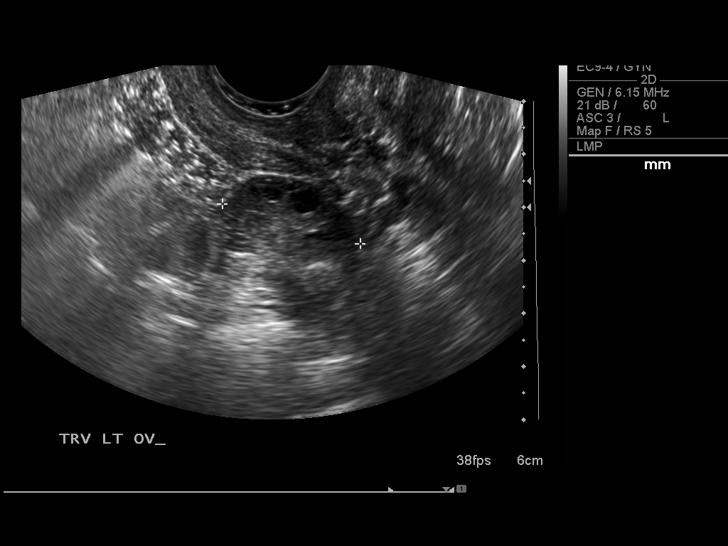
[im 22/30]
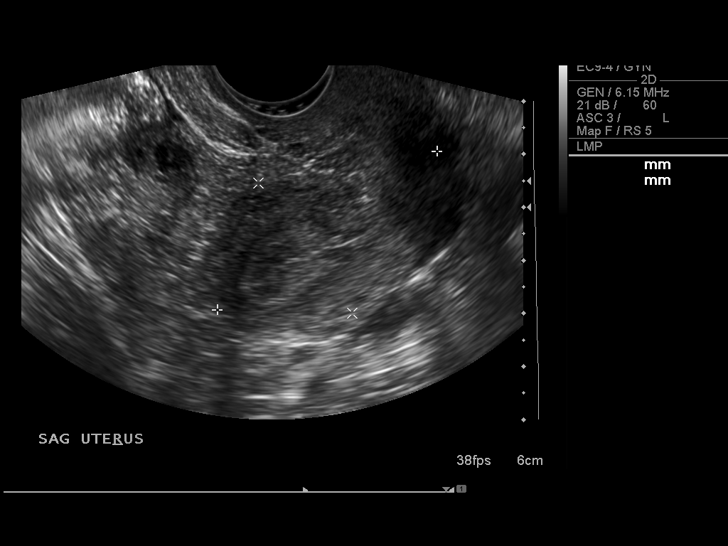
[im 25/30]
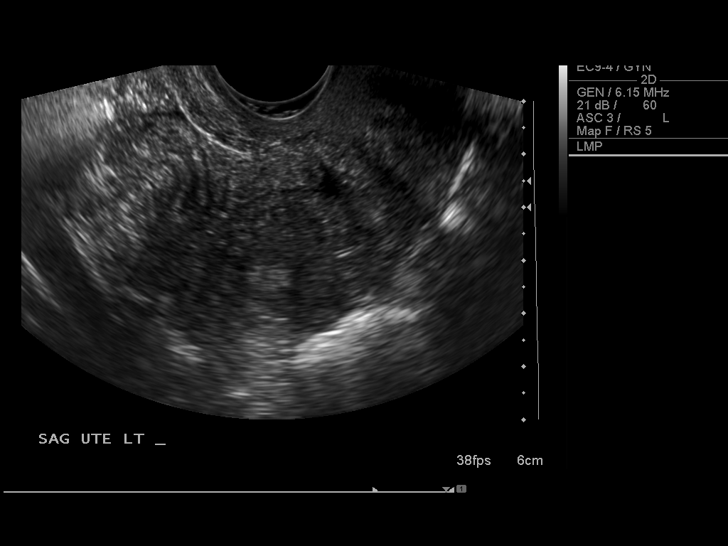
[im 27/30]
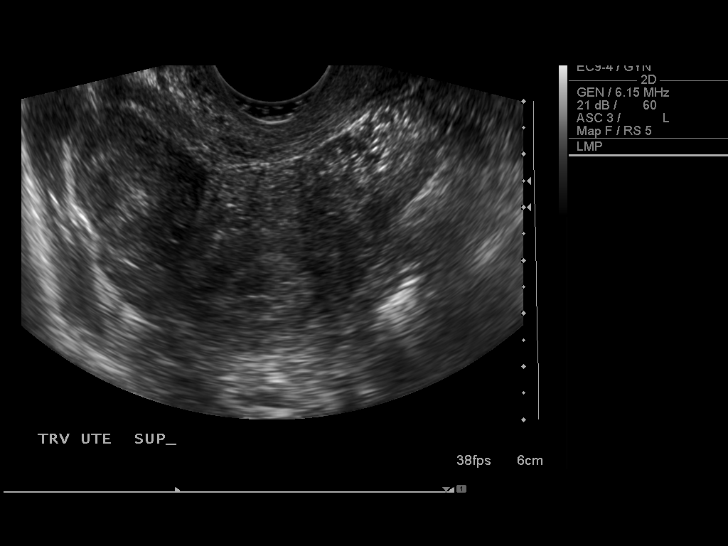
[im 30/30]
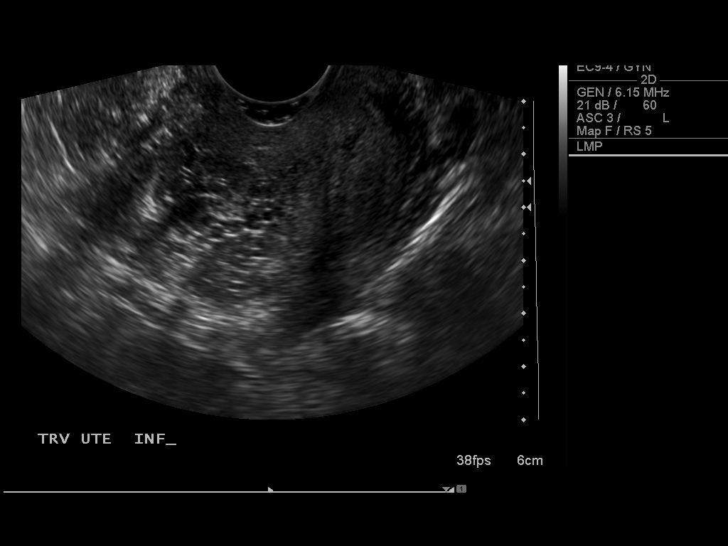

[14 of 25 positions shown; findings below may reference images not displayed]

FINDINGS: Uterus:  Normal in size and appearance measuring 5.1 x 3.0 x
cm.   No focal lesions.

Endometrium:  Normal in thickness and appearance measuring 12.7 mm.

Right ovary: Normal appearance/no adnexal mass. The ovary measures
3.0 x 2.6 x 3.6 cm.  Multiple small follicles.

Left ovary:   Normal appearance/no adnexal mass.  The ovary
measures 2.1 x 1.8-0.7 cm.  Multiple small follicles.

No free fluid the cul-de-sac.
IMPRESSION: Normal exam.  No evidence of pelvic mass or other significant
abnormality.

## 2013-09-08 ENCOUNTER — Encounter (HOSPITAL_COMMUNITY): Payer: Self-pay | Admitting: Emergency Medicine

## 2013-09-08 ENCOUNTER — Emergency Department (HOSPITAL_COMMUNITY)
Admission: EM | Admit: 2013-09-08 | Discharge: 2013-09-08 | Disposition: A | Discharge status: Home / Self Care | Payer: No Typology Code available for payment source | Attending: Emergency Medicine | Admitting: Emergency Medicine

## 2013-09-08 ENCOUNTER — Emergency Department (HOSPITAL_COMMUNITY): Payer: No Typology Code available for payment source

## 2013-09-08 DIAGNOSIS — Z8739 Personal history of other diseases of the musculoskeletal system and connective tissue: Secondary | ICD-10-CM | POA: Insufficient documentation

## 2013-09-08 DIAGNOSIS — H409 Unspecified glaucoma: Secondary | ICD-10-CM | POA: Insufficient documentation

## 2013-09-08 DIAGNOSIS — R0602 Shortness of breath: Secondary | ICD-10-CM | POA: Insufficient documentation

## 2013-09-08 DIAGNOSIS — Z862 Personal history of diseases of the blood and blood-forming organs and certain disorders involving the immune mechanism: Secondary | ICD-10-CM | POA: Insufficient documentation

## 2013-09-08 DIAGNOSIS — R072 Precordial pain: Secondary | ICD-10-CM

## 2013-09-08 DIAGNOSIS — R079 Chest pain, unspecified: Secondary | ICD-10-CM

## 2013-09-08 DIAGNOSIS — R0789 Other chest pain: Secondary | ICD-10-CM | POA: Insufficient documentation

## 2013-09-08 DIAGNOSIS — Z79899 Other long term (current) drug therapy: Secondary | ICD-10-CM | POA: Insufficient documentation

## 2013-09-08 DIAGNOSIS — Z8679 Personal history of other diseases of the circulatory system: Secondary | ICD-10-CM | POA: Insufficient documentation

## 2013-09-08 DIAGNOSIS — Z8742 Personal history of other diseases of the female genital tract: Secondary | ICD-10-CM | POA: Insufficient documentation

## 2013-09-08 NOTE — ED Notes (Signed)
Pt @ x-ray

## 2013-09-08 NOTE — Discharge Instructions (Signed)

## 2013-09-08 NOTE — ED Notes (Signed)
Patient c/o intermittent chest pain x 2 days. Patient states she has a history of mitral valve prolapse. Patient states she had SOB yesterday with chest pain none today.

## 2013-09-08 NOTE — ED Provider Notes (Signed)
CSN: [id number]     Arrival date & time 09/08/13  1432 History   First MD Initiated Contact with Patient 09/08/13 1503     Chief Complaint  Patient presents with  . Chest Pain     (Consider location/radiation/quality/duration/timing/severity/associated sxs/prior Treatment) Patient is a 23 y.o. female presenting with chest pain. The history is provided by the patient.  Chest Pain Pain location:  Substernal area Pain quality: tightness   Pain radiates to:  Neck Pain radiates to the back: no   Pain severity:  Moderate Onset quality:  Sudden Duration:  2 days Timing:  Intermittent Progression:  Improving Chronicity:  New Context: at rest   Relieved by:  Nothing Worsened by:  Nothing tried Associated symptoms: shortness of breath   Associated symptoms: no abdominal pain, no cough, no fever, no nausea and not vomiting     Past Medical History  Diagnosis Date  . Mitral valve prolapse   . Scoliosis   . Sickle cell trait   . Ovarian cyst   . Glaucoma    History reviewed. No pertinent past surgical history. Family History  Problem Relation Age of Onset  . Hypertension Mother   . Diabetes Other   . Hypertension Other   . Cancer Other    History  Substance Use Topics  . Smoking status: Never Smoker   . Smokeless tobacco: Not on file  . Alcohol Use: Yes     Comment: occ   OB History   Grav Para Term Preterm Abortions TAB SAB Ect Mult Living                 Review of Systems  Constitutional: Negative for fever.  Respiratory: Positive for shortness of breath. Negative for cough.   Cardiovascular: Positive for chest pain.  Gastrointestinal: Negative for nausea, vomiting and abdominal pain.  All other systems reviewed and are negative.     Allergies  Corn-containing products  Home Medications   Prior to Admission medications   Medication Sig Start Date End Date Taking? Authorizing Provider  latanoprost (XALATAN) 0.005 % ophthalmic solution Place 1 drop into  both eyes at bedtime.   Yes Historical Provider, MD  Multiple Vitamin (MULITIVITAMIN WITH MINERALS) TABS Take 1 tablet by mouth at bedtime.    Yes Historical Provider, MD   BP 120/82  Pulse 77  Temp(Src) 98.2 F (36.8 C) (Oral)  Resp 18  SpO2 100%  LMP 08/30/2013 Physical Exam  Nursing note and vitals reviewed. Constitutional: She is oriented to person, place, and time. She appears well-developed and well-nourished. No distress.  HENT:  Head: Normocephalic and atraumatic.  Eyes: EOM are normal. Pupils are equal, round, and reactive to light.  Neck: Normal range of motion. Neck supple.  Cardiovascular: Normal rate and regular rhythm.  Exam reveals no friction rub.   No murmur heard. Split S2  Pulmonary/Chest: Effort normal and breath sounds normal. No respiratory distress. She has no wheezes. She has no rales.  Abdominal: Soft. She exhibits no distension. There is no tenderness. There is no rebound.  Musculoskeletal: Normal range of motion. She exhibits no edema.  Neurological: She is alert and oriented to person, place, and time.  Skin: She is not diaphoretic.    ED Course  Procedures (including critical care time) Labs Review Labs Reviewed - No data to display  Imaging Review Dg Chest 2 View  09/08/2013   CLINICAL DATA:  Chest pain  EXAM: CHEST  2 VIEW  COMPARISON:  None.  FINDINGS: The  heart size and mediastinal contours are within normal limits. Both lungs are clear. Mild scoliosis.  IMPRESSION: No active cardiopulmonary disease.   Electronically Signed   By: Marlan Palau M.D.   On: 09/08/2013 15:31     EKG Interpretation   Date/Time:  Thursday September 08 2013 14:41:14 EDT Ventricular Rate:  81 PR Interval:  120 QRS Duration: 60 QT Interval:  359 QTC Calculation: 417 R Axis:   65 Text Interpretation:  Sinus rhythm No prior Confirmed by Gwendolyn Grant  MD, Jawanza Zambito  ([id number]) on 09/08/2013 3:05:35 PM      MDM   Final diagnoses:  Chest pain, unspecified chest pain type    29F  with hx of MVP presents with intermitent chest pani for the past few days. Described as tightness into her neck. Longest episode about 20 minutes last night with some mild SOB. Had an episode today that was 5-10 minutes. Has never been on meds for her MVP. Hx of CP with her MVP, however was different and many years ago. Followed by [facility] in [place], has not seen her Cardiologist in several years, she is here for school. Patient here with benign exam, no murmur, has split S2. No fevers, cough, N/V.  EKG ok. I spoke with Dr. Mayford Knife of [facility] who recommended Echo to look for pericardial effusion. Echo normal. I spoke with Dr. Excell Seltzer who was taking [facility] call who stated she can f/u with her Cardiologist in [place]. Given [facility] f/u numbers here if she needs them. Stable for discharge.  Dagmar Hait, MD 09/09/13 [phone]

## 2013-09-08 NOTE — ED Notes (Addendum)
Pt reports pain "a little" better at this time, reports feels pain when she touches her chest. Pt aware awaiting ECHO at this time.  NAD.

## 2013-09-08 NOTE — ED Notes (Signed)
ECHO at bedside.

## 2013-09-08 NOTE — ED Notes (Signed)
Initial Contact - pt resting on stretcher, reports central CP and pt also points to R upper chest when describing pain, x2 days.  Pt reports intermittent, with no noticeable alleviating factors.  Pt reports presents to ED because "it was lasting a long time".  Skin PWD.  Speaking full/clear sentences, rr even/un-lab.  Pt reports pain reproducible with inspiration.  Pt denies SOB.  A+Ox4.  MAEI.  NAD.

## 2013-09-08 NOTE — Progress Notes (Signed)
Echocardiogram 2D Echocardiogram has been performed.  Dorothey BasemanReel, Faolan Springfield M 09/08/2013, 4:31 PM

## 2014-05-20 ENCOUNTER — Emergency Department (HOSPITAL_COMMUNITY)
Admission: EM | Admit: 2014-05-20 | Discharge: 2014-05-20 | Disposition: A | Discharge status: Home / Self Care | Payer: No Typology Code available for payment source | Attending: Emergency Medicine | Admitting: Emergency Medicine

## 2014-05-20 ENCOUNTER — Emergency Department (HOSPITAL_COMMUNITY)
Admission: EM | Admit: 2014-05-20 | Discharge: 2014-05-20 | Discharge status: Absent without leave | Payer: PRIVATE HEALTH INSURANCE

## 2014-05-20 ENCOUNTER — Encounter (HOSPITAL_COMMUNITY): Payer: Self-pay | Admitting: Emergency Medicine

## 2014-05-20 ENCOUNTER — Emergency Department (HOSPITAL_COMMUNITY): Payer: No Typology Code available for payment source

## 2014-05-20 DIAGNOSIS — Z862 Personal history of diseases of the blood and blood-forming organs and certain disorders involving the immune mechanism: Secondary | ICD-10-CM | POA: Insufficient documentation

## 2014-05-20 DIAGNOSIS — J40 Bronchitis, not specified as acute or chronic: Secondary | ICD-10-CM

## 2014-05-20 DIAGNOSIS — B349 Viral infection, unspecified: Secondary | ICD-10-CM

## 2014-05-20 DIAGNOSIS — Z8739 Personal history of other diseases of the musculoskeletal system and connective tissue: Secondary | ICD-10-CM | POA: Insufficient documentation

## 2014-05-20 DIAGNOSIS — Z3202 Encounter for pregnancy test, result negative: Secondary | ICD-10-CM | POA: Insufficient documentation

## 2014-05-20 DIAGNOSIS — Z8742 Personal history of other diseases of the female genital tract: Secondary | ICD-10-CM | POA: Insufficient documentation

## 2014-05-20 DIAGNOSIS — Z8679 Personal history of other diseases of the circulatory system: Secondary | ICD-10-CM | POA: Insufficient documentation

## 2014-05-20 DIAGNOSIS — J209 Acute bronchitis, unspecified: Secondary | ICD-10-CM | POA: Insufficient documentation

## 2014-05-20 DIAGNOSIS — Z79899 Other long term (current) drug therapy: Secondary | ICD-10-CM | POA: Insufficient documentation

## 2014-05-20 DIAGNOSIS — Z8669 Personal history of other diseases of the nervous system and sense organs: Secondary | ICD-10-CM | POA: Insufficient documentation

## 2014-05-20 LAB — URINALYSIS, ROUTINE W REFLEX MICROSCOPIC
BILIRUBIN URINE: NEGATIVE
Glucose, UA: NEGATIVE mg/dL
HGB URINE DIPSTICK: NEGATIVE
KETONES UR: NEGATIVE mg/dL
Nitrite: NEGATIVE
PH: 6.5 (ref 5.0–8.0)
PROTEIN: NEGATIVE mg/dL
SPECIFIC GRAVITY, URINE: 1.016 (ref 1.005–1.030)
UROBILINOGEN UA: 0.2 mg/dL (ref 0.0–1.0)

## 2014-05-20 LAB — RAPID STREP SCREEN (MED CTR MEBANE ONLY): Streptococcus, Group A Screen (Direct): NEGATIVE

## 2014-05-20 LAB — URINE MICROSCOPIC-ADD ON

## 2014-05-20 LAB — POC URINE PREG, ED: Preg Test, Ur: NEGATIVE

## 2014-05-20 MED ORDER — AZITHROMYCIN 250 MG PO TABS: 250.0000 mg | ORAL_TABLET | Freq: Every day | ORAL | Status: AC

## 2014-05-20 NOTE — ED Provider Notes (Signed)
CSN: [id number]     Arrival date & time 05/20/14  1539 History   First MD Initiated Contact with Patient 05/20/14 1613     Chief Complaint  Patient presents with  . Fever  . Cough  . Nasal Congestion  . Nausea   Patient is a 24 y.o. female presenting with fever and cough. The history is provided by the patient. No language interpreter was used.  Fever Associated symptoms: chills, congestion, cough, diarrhea, sore throat and vomiting   Associated symptoms: no dysuria, no ear pain, no nausea and no rhinorrhea   Cough Associated symptoms: chills, fever (Subjective) and sore throat   Associated symptoms: no ear pain and no rhinorrhea    This chart was scribed for non-physician practitioner Raymon Mutton, PA-C, working with Richardean Canal, MD, by Andrew Au, ED Scribe. This patient was seen in room WTR8/WTR8 and the patient's care was started at 4:19 PM.  Mallory Dickerson is a 24 y.o. female with past medical history of mitral valve prolapse, scoliosis, sickle cell trait, glaucoma, ovarian cyst presenting to the ED with sore throat, productive cough, nasal congestion, subjective fever that has been ongoing for approximately 2 weeks. Patient reports that when she blows her nose she has a yellowish, greenish sputum. Reported that when she coughs she coughs up a green yellow sputum. Reported that she's been having subjective fever. Reported nausea with one episode of emesis in the beginning of the week last week-reported that she's been eating and drinking fine without episodes of emesis lately. Reported intermittent episodes of diarrhea mainly watery-denied mucus or blood. Patient reported that she's been using NyQuil, DayQuil and thick stapler ref. Denied flu vaccine this year. Reported that friends that she shares strings with as well as food with been sick with similar symptoms. Denied chills, chest pain, shortness of breath, difficulty breathing, neck pain, neck stiffness, blurred vision, sudden loss of  vision, abdominal pain, back pain, hemoptysis, your pain, ear drainage, melena, hematochezia, travels, dizziness, blurred vision, sudden loss of vision, fainting. PCP Dr. Catha Brow in [place]   Past Medical History  Diagnosis Date  . Mitral valve prolapse   . Scoliosis   . Sickle cell trait   . Ovarian cyst   . Glaucoma    History reviewed. No pertinent past surgical history. Family History  Problem Relation Age of Onset  . Hypertension Mother   . Diabetes Other   . Hypertension Other   . Cancer Other    History  Substance Use Topics  . Smoking status: Never Smoker   . Smokeless tobacco: Not on file  . Alcohol Use: Yes     Comment: occ   OB History    No data available     Review of Systems  Constitutional: Positive for fever (Subjective) and chills.  HENT: Positive for congestion and sore throat. Negative for ear discharge, ear pain and rhinorrhea.   Eyes: Negative for pain and visual disturbance.  Respiratory: Positive for cough.   Gastrointestinal: Positive for vomiting and diarrhea. Negative for nausea, abdominal pain, constipation, blood in stool and anal bleeding.  Genitourinary: Negative for dysuria, hematuria and difficulty urinating.  Musculoskeletal: Negative for back pain, neck pain and neck stiffness.  Neurological: Negative for dizziness, syncope, weakness, light-headedness and numbness.   Allergies  Corn-containing products  Home Medications   Prior to Admission medications   Medication Sig Start Date End Date Taking? Authorizing Provider  DM-Doxylamine-Acetaminophen (NYQUIL COLD & FLU PO) Take 30 mLs by mouth at  bedtime as needed (cough).   Yes Historical Provider, MD  Multiple Vitamins-Minerals (AIRBORNE) CHEW Chew 2 tablets by mouth 2 (two) times daily.   Yes Historical Provider, MD  Pseudoephedrine-APAP-DM (DAYQUIL MULTI-SYMPTOM COLD/FLU PO) Take 30 mLs by mouth every 6 (six) hours as needed (cough).   Yes Historical Provider, MD   BP 108/63 mmHg   Pulse 80  Temp(Src) 98.3 F (36.8 C) (Oral)  Resp 17  SpO2 100%  LMP 05/05/2014 (Approximate) Physical Exam  Constitutional: She is oriented to person, place, and time. She appears well-developed and well-nourished. No distress.  HENT:  Head: Normocephalic and atraumatic.  Right Ear: Hearing, tympanic membrane, external ear and ear canal normal. No drainage, swelling or tenderness. No foreign bodies. No mastoid tenderness. Tympanic membrane is not injected, not scarred, not perforated, not erythematous, not retracted and not bulging. No decreased hearing is noted.  Left Ear: Hearing, tympanic membrane, external ear and ear canal normal. No drainage, swelling or tenderness. No foreign bodies. No mastoid tenderness. Tympanic membrane is not injected, not scarred, not perforated, not erythematous, not retracted and not bulging. No decreased hearing is noted.  Mouth/Throat: Oropharynx is clear and moist. No oropharyngeal exudate.  Negative trismus  Eyes: Conjunctivae and EOM are normal. Pupils are equal, round, and reactive to light. Right eye exhibits no discharge. Left eye exhibits no discharge.  Neck: Normal range of motion. Neck supple. No tracheal deviation present.  Negative neck stiffness Negative nuchal rigidity  Negative cervical lymphadenopathy  Negative meningeal signs   Cardiovascular: Normal rate, regular rhythm and normal heart sounds.   Cap refill < 3 seconds  Pulmonary/Chest: Effort normal and breath sounds normal. No respiratory distress. She has no wheezes. She has no rales. She exhibits no tenderness.  Patient is able to speak in full sentences without difficulty  Negative use of accessory muscles Negative stridor  Abdominal: Soft. Bowel sounds are normal. She exhibits no distension. There is no tenderness. There is no rebound and no guarding.  Musculoskeletal: Normal range of motion.  Full ROM to upper and lower extremities without difficulty noted, negative ataxia  noted.  Lymphadenopathy:    She has no cervical adenopathy.  Neurological: She is alert and oriented to person, place, and time. No cranial nerve deficit. She exhibits normal muscle tone. Coordination normal.  Cranial nerves III-XII grossly intact Strength 5+/5+ to upper and lower extremities bilaterally with resistance applied, equal distribution noted Patient follows commands well  Patient responds to questions appropriately  Gait proper, proper balance - negative sway, negative drift, negative step-offs  Skin: Skin is warm and dry. No rash noted. No erythema.  Psychiatric: She has a normal mood and affect. Her behavior is normal.  Nursing note and vitals reviewed.   ED Course  Procedures (including critical care time) DIAGNOSTIC STUDIES: Oxygen Saturation is 100% on RA, normal by my interpretation.    COORDINATION OF CARE: 4:43 PM- Pt advised of plan for treatment and pt agrees.   Results for orders placed or performed during the hospital encounter of 05/20/14  Rapid strep screen  Result Value Ref Range   Streptococcus, Group A Screen (Direct) NEGATIVE NEGATIVE  Urinalysis, Routine w reflex microscopic  Result Value Ref Range   Color, Urine YELLOW YELLOW   APPearance CLOUDY (A) CLEAR   Specific Gravity, Urine 1.016 1.005 - 1.030   pH 6.5 5.0 - 8.0   Glucose, UA NEGATIVE NEGATIVE mg/dL   Hgb urine dipstick NEGATIVE NEGATIVE   Bilirubin Urine NEGATIVE NEGATIVE  Ketones, ur NEGATIVE NEGATIVE mg/dL   Protein, ur NEGATIVE NEGATIVE mg/dL   Urobilinogen, UA 0.2 0.0 - 1.0 mg/dL   Nitrite NEGATIVE NEGATIVE   Leukocytes, UA TRACE (A) NEGATIVE  Urine microscopic-add on  Result Value Ref Range   Squamous Epithelial / LPF MANY (A) RARE   WBC, UA 0-2 <3 WBC/hpf   RBC / HPF 0-2 <3 RBC/hpf   Urine-Other MUCOUS PRESENT   POC urine preg, ED [facility])  Result Value Ref Range   Preg Test, Ur NEGATIVE NEGATIVE    Labs Review Labs Reviewed  RAPID STREP SCREEN  CULTURE, GROUP A  STREP  URINALYSIS, ROUTINE W REFLEX MICROSCOPIC  POC URINE PREG, ED   Results for orders placed or performed during the hospital encounter of 05/20/14  Rapid strep screen  Result Value Ref Range   Streptococcus, Group A Screen (Direct) NEGATIVE NEGATIVE    Imaging Review No results found.   EKG Interpretation None      MDM   Final diagnoses:  None    Medications - No data to display  Filed Vitals:   05/20/14 1554  BP: 108/63  Pulse: 80  Temp: 98.3 F (36.8 C)  TempSrc: Oral  Resp: 17  SpO2: 100%   I personally performed the services described in this documentation, which was scribed in my presence. The recorded information has been reviewed and is accurate.  Urine pregnancy negative. Urinalysis negative for hemoglobin, nitrites, trace of leukocytes identified with a white blood cell count of 0-2 - squamous cells identified, many appears to be a contaminated specimen. Urine culture pending. Rapid strep test negative. Chest x-ray no active cautery pulmonary disease noted. Negative findings of pneumonia. Negative findings of UTI or pyelonephritis. Negative findings of streptococcal pharyngitis. Doubt peritonsillar abscess. Doubt retropharyngeal abscess. Doubt mastoiditis. Doubt meningitis. Doubt acute abdominal processes-abdominal exam unremarkable. Suspicion to be bronchitis, viral syndrome. Patient stable, afebrile. Patient not septic appearing. Negative signs of respiratory distress. Discharged patient. Discharge patient with Z-Pak. Discussed with patient to rest and stay hydrated. Discussed with patient to [facility]. Discussed with patient to closely monitor symptoms and if symptoms are to worsen or change to report back to the ED - strict return instructions given.  Patient agreed to plan of care, understood, all questions answered.   Raymon MuttonMarissa Mariella Blackwelder, PA-C 05/20/14 1816  Richardean Canalavid H Yao, MD 05/20/14 [phone]

## 2014-05-20 NOTE — Discharge Instructions (Signed)
Please call your doctor for a followup appointment within 24-48 hours. When you talk to your doctor please let them know that you were seen in the emergency department and have them acquire all of your records so that they can discuss the findings with you and formulate a treatment plan to fully care for your new and ongoing problems. Please follow-up with health and wellness Center Please rest and stay hydrated Please avoid any physical strenuous activity Please take medications as prescribed and on a full stomach Please continue to monitor symptoms closely and if symptoms are to worsen or change (fever greater than 101, chills, sweating, nausea, vomiting, chest pain, shortness of breathe, difficulty breathing, weakness, numbness, tingling, worsening or changes to pain pattern, coughing up blood, inability keep food or fluids down, stomach pain, dizziness, fainting) please report back to the Emergency Department immediately.   Acute Bronchitis Bronchitis is inflammation of the airways that extend from the windpipe into the lungs (bronchi). The inflammation often causes mucus to develop. This leads to a cough, which is the most common symptom of bronchitis.  In acute bronchitis, the condition usually develops suddenly and goes away over time, usually in a couple weeks. Smoking, allergies, and asthma can make bronchitis worse. Repeated episodes of bronchitis may cause further lung problems.  CAUSES Acute bronchitis is most often caused by the same virus that causes a cold. The virus can spread from person to person (contagious) through coughing, sneezing, and touching contaminated objects. SIGNS AND SYMPTOMS   Cough.   Fever.   Coughing up mucus.   Body aches.   Chest congestion.   Chills.   Shortness of breath.   Sore throat.  DIAGNOSIS  Acute bronchitis is usually diagnosed through a physical exam. Your health care provider will also ask you questions about your medical  history. Tests, such as chest X-rays, are sometimes done to rule out other conditions.  TREATMENT  Acute bronchitis usually goes away in a couple weeks. Oftentimes, no medical treatment is necessary. Medicines are sometimes given for relief of fever or cough. Antibiotic medicines are usually not needed but may be prescribed in certain situations. In some cases, an inhaler may be recommended to help reduce shortness of breath and control the cough. A cool mist vaporizer may also be used to help thin bronchial secretions and make it easier to clear the chest.  HOME CARE INSTRUCTIONS  Get plenty of rest.   Drink enough fluids to keep your urine clear or pale yellow (unless you have a medical condition that requires fluid restriction). Increasing fluids may help thin your respiratory secretions (sputum) and reduce chest congestion, and it will prevent dehydration.   Take medicines only as directed by your health care provider.  If you were prescribed an antibiotic medicine, finish it all even if you start to feel better.  Avoid smoking and secondhand smoke. Exposure to cigarette smoke or irritating chemicals will make bronchitis worse. If you are a smoker, consider using nicotine gum or skin patches to help control withdrawal symptoms. Quitting smoking will help your lungs heal faster.   Reduce the chances of another bout of acute bronchitis by washing your hands frequently, avoiding people with cold symptoms, and trying not to touch your hands to your mouth, nose, or eyes.   Keep all follow-up visits as directed by your health care provider.  SEEK MEDICAL CARE IF: Your symptoms do not improve after 1 week of treatment.  SEEK IMMEDIATE MEDICAL CARE IF:  You develop  an increased fever or chills.   You have chest pain.   You have severe shortness of breath.  You have bloody sputum.   You develop dehydration.  You faint or repeatedly feel like you are going to pass out.  You  develop repeated vomiting.  You develop a severe headache. MAKE SURE YOU:   Understand these instructions.  Will watch your condition.  Will get help right away if you are not doing well or get worse. Document Released: 03/27/2004 Document Revised: 07/04/2013 Document Reviewed: 08/10/2012 [facility] Patient Information 2015 [place], [place]. This information is not intended to replace advice given to you by your health care provider. Make sure you discuss any questions you have with your health care provider.   Emergency Department Resource Guide 1) Find a Doctor and Pay Out of Pocket Although you won't have to find out who is covered by your insurance plan, it is a good idea to ask around and get recommendations. You will then need to call the office and see if the doctor you have chosen will accept you as a new patient and what types of options they offer for patients who are self-pay. Some doctors offer discounts or will set up payment plans for their patients who do not have insurance, but you will need to ask so you aren't surprised when you get to your appointment.  2) Contact Your Local Health Department Not all health departments have doctors that can see patients for sick visits, but many do, so it is worth a call to see if yours does. If you don't know where your local health department is, you can check in your phone book. The CDC also has a tool to help you locate your state's health department, and many state websites also have listings of all of their local health departments.  3) Find a Walk-in Clinic If your illness is not likely to be very severe or complicated, you may want to try a walk in clinic. These are popping up all over the country in pharmacies, drugstores, and shopping centers. They're usually staffed by nurse practitioners or physician assistants that have been trained to treat common illnesses and complaints. They're usually fairly quick and inexpensive. However, if  you have serious medical issues or chronic medical problems, these are probably not your best option.  No Primary Care Doctor: - Call Health Connect at  [phone] - they can help you locate a primary care doctor that  accepts your insurance, provides certain services, etc. - Physician Referral Service- [phone]  Chronic Pain Problems: Organization         Address  Phone   Notes  [facility]  [phone] Patients need to be referred by their primary care doctor.   Medication Assistance: Organization         Address  Phone   Notes  [facility] [address] [place], [place] [phone] --Must be a resident of [facility] [place] -- Must have NO insurance coverage whatsoever (no Medicaid/ Medicare, etc.) -- The pt. MUST have a primary care doctor that directs their care regularly and follows them in the community   [facility]  [phone]   [employer]  [phone]    Agencies that provide inexpensive medical care: Organization         Address  Phone   Notes  [facility]  [phone]   [facility]    (  [phone]   [facility] [address] [place], [place] [address] [phone]   [facility] [address], [place] [phone]   Planned Parenthood    [phone]   [facility]    [phone]   [facility]  [address], [place] Phone:  [phone], Fax:  [phone] Hours of Operation:  9 am - 6 pm, M-F.  Also accepts Medicaid/Medicare and self-pay.  [facility]  [address], [place] Phone: [phone], Fax: [phone]. Hours of Operation:  8:30 am - 5:30 pm, M-F.  Also accepts Medicaid and self-pay.  [facility] [place] [address], [place] Phone: [phone]   [facility] [address] Natasha Bence [place], [place] [phone], Ext. [phone] Mondays & Thursdays: 7-9 AM.  First 15 patients are seen on a first come, first serve basis.    Medicaid-accepting [facility] Providers:  Organization         Address  Phone   Notes  [facility] [address], [place] [phone] Also accepts self-pay patients.  [facility] [address] Laurell Josephs [place], [place]  [phone]   [facility] [address], [place] [phone]   [facility] [address], [place] [phone]   Renaye Rakers [address], [place]   [phone] Only accepts [place] patients after they have their name applied to their card.   Self-Pay (no insurance) in [facility]:  Organization         Address  Phone   Notes  Sickle Cell Patients, [facility] [address] [place], [place] [phone]   [facility] [address] [place], [place] [phone]   [facility] [place]  [address], [place] [phone]   [facility]/Dr. Osei-Bonsu  [address] [place] or [address], [place] [phone] Phone number for both [place] and [place] locations is the same.  [facility]gent Medical and [facility] [address], [place] [phone]   [facility] [address], [place] or [address] [phone]   [facility] [address] [place] [phone], phone; [phone], fax Sees patients 1st and 3rd Saturday of every month.  Must not qualify for public or private insurance (i.e. Medicaid, Medicare, [place] Health Choice, Veterans' Benefits)  Household income should be no more than 200% of the poverty level The clinic cannot treat you if you are pregnant or think you are  pregnant  Sexually transmitted diseases are not treated at the clinic.    Dental Care: Organization         Address  Phone  Notes  [facility] Department of [facility] [address] [place], [place] [phone] Accepts children up to age 97 who are enrolled in [place] or [place] Health Choice; pregnant women with a Medicaid card; and children who have applied for Medicaid or  Health Choice, but were declined, whose parents can pay a reduced fee at time of service.  [facility] of [facility]  [address], [place] [phone] Accepts children up  to age 21 who are enrolled in Medicaid or [place] Health Choice; pregnant women with a Medicaid card; and children who have applied for Medicaid or [place] Health Choice, but were declined, whose parents can pay a reduced fee at time of service.  [facility]  [address] [place], [place] [phone] Patients are seen by appointment only. Walk-ins are not accepted. [facility] will see patients 29 years of age and older. Monday - Tuesday (8am-5pm) Most Wednesdays (8:30-5pm) $30 per visit, cash only  [facility] Adult [facility] Access PROGRAM  [address], [facility] [phone] Patients are seen by appointment only. Walk-ins are not accepted. [facility] will see patients 19 years of age and older. One Wednesday Evening (Monthly: Volunteer Based).  $30 per visit, cash only  [employer]  [phone] for adults; Children under age 60, call Graduate Pediatric Dentistry at [phone]. Children aged 43-14, please call [phone] to request a pediatric application.  Dental services are provided in all areas of dental care including fillings, crowns and bridges, complete and partial dentures, implants, gum treatment, root canals, and extractions. Preventive care is also provided. Treatment is provided to both adults and  children. Patients are selected via a lottery and there is often a waiting list.   [facility] [address], [place]  [phone] [email or web address]   [facility] [address] [place], [place] [phone], Ext. [phone] Second and Fourth Thursday of each month, opens at 6:30 AM; Clinic ends at 9 AM.  Patients are seen on a first-come first-served basis, and a limited number are seen during each clinic.   [facility]  [address] Ether Griffins [place], [place] [phone]   Eligibility Requirements You must have lived in [place], [place], or [place] counties for at least the last three months.   You cannot be eligible for state or federal sponsored [place], including [employer], [place], or [employer].   You generally cannot be eligible for healthcare insurance through your employer.    How to apply: Eligibility screenings are held every Tuesday and Wednesday afternoon from 1:00 pm until 4:00 pm. You do not need an appointment for the interview!  [facility] [address] [place], [place] [phone]   [facility] Health Department  [phone]   [facility] Health Department  [phone]   [facility]  [phone]    Behavioral Health Resources in the Community: Intensive Outpatient Programs Organization         Address  Phone  Notes  [facility] [address] [place], [place] [phone]   [facility] Outpatient [address], [place], [place] [phone]   [facility] [address], [place], [place]  [phone]   [facility] Mental Health [address]  [place], [place] [phone] or [phone]   Substance Abuse Resources Organization         Address  Phone  Notes  Al[facility] Drug Services  [phone]   [facility]  [phone]   [facility] [place]  [phone]    Floydene Flock  [phone]   Residential & Outpatient Substance Abuse Program  [phone]   Psychological Services Organization         Address  Phone  Notes  [facility] Behavioral Health  [phone]   [place] [facility]  [phone]   [facility] Mental [facility]  [address] [place] [phone] or [phone]    Mobile Crisis Teams Organization         Address  Phone  Notes  [facility], Mobile Crisis Care Unit  [phone]   [facility]  [address] [place] [address] [place], [place] [phone]   Doristine Locks [address] [place] [phone]    Self-Help/Support Groups Organization         Address  Phone             Notes  [facility] [place] - variety of support groups  336- I7437963 Call for more information  Narcotics Anonymous (NA), [facility] [address], [employer] [place]  2 meetings at this location   Statistician         Address  Phone  Notes  [facility] [address],    [place]  [phone]   [facility]  [address], [place] [address], [place], [place] [phone]   [facility] [address] [place], [place] [phone] Admissions: 8am-3pm M-F  [facility] [address]    [place], [place] [phone]   [facility] [address] [place], [place], [place] [phone]   [facility] [address]  [place], [place] [phone]   [facility] - Intensive Outpatient [address] Laurell Josephs [address], [place], [place] [phone]   [facility] (Addiction Recovery [facility] [address] [place]  [place], [place] [phone] or [phone]   [facility] ([facility]) [address], [place], [place] [phone] Accepts Medicaid  [facility] [place] [address]  [place] [phone] Substance Abuse/Addiction Treatment   [facility] Organization         Address  Phone  Notes  [facility]  [phone]   Angie Fava, PhD [address] Ervin Knack [place], [place]   [phone] or [phone]   [facility] Behavioral   [address] [place], [place] [phone]   [facility] [address] [place], [place] [phone] Insurance/Medicaid/sponsorship through [facility] and Families [address]                                    [place], [place] [phone] Therapy/tele-psych/case  [facility] [address], [place] [phone]    Dr. Lolly Mustache  [phone]   [facility]  [employer] [facility] Dept. 1) [address], [place] 2) [address] [place] 3)  [address], [place] [phone]  [phone]   [facility] Child Abuse Hotline 820-057-1639 or [phone] (After Hours)

## 2014-05-20 NOTE — ED Notes (Signed)
Pt c/o cough with nasal congestion and green/yellow sputum x2 weeks. Has not been seen for this. Tried vicks vapor rub yesterday and mother says, "she started to have a productive cough after that." Denies SOB, abdominal pain. Says she has had intermittent nausea. Emesis x1 last week. No active vomiting. In NAD. Reports fever at home but did not take it at home so unsure of exact temperature. No other c/c.

## 2014-05-22 LAB — URINE CULTURE: SPECIAL REQUESTS: NORMAL

## 2014-05-23 LAB — CULTURE, GROUP A STREP

## 2014-07-07 ENCOUNTER — Ambulatory Visit (HOSPITAL_COMMUNITY)
Admission: RE | Admit: 2014-07-07 | Discharge: 2014-07-07 | Disposition: A | Discharge status: Home / Self Care | Payer: PRIVATE HEALTH INSURANCE | Attending: Psychiatry | Admitting: Psychiatry

## 2014-07-07 DIAGNOSIS — D573 Sickle-cell trait: Secondary | ICD-10-CM | POA: Diagnosis not present

## 2014-07-07 DIAGNOSIS — F338 Other recurrent depressive disorders: Secondary | ICD-10-CM | POA: Diagnosis not present

## 2014-07-07 DIAGNOSIS — M419 Scoliosis, unspecified: Secondary | ICD-10-CM | POA: Diagnosis not present

## 2014-07-07 DIAGNOSIS — I341 Nonrheumatic mitral (valve) prolapse: Secondary | ICD-10-CM | POA: Insufficient documentation

## 2014-07-07 DIAGNOSIS — H409 Unspecified glaucoma: Secondary | ICD-10-CM | POA: Diagnosis not present

## 2014-07-07 NOTE — BH Assessment (Addendum)
Tele Assessment Note   Mallory Dickerson is an 24 y.o. female who came [facility] as a walk in patient, accompanied by her mother, who is here from [place]. 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 She lives with a roommate in student housing. Pt says  That she just told her mom about SI she was having 2 weeks ago. She says she has not had any thoughts in 2 weeks, but she was researching plans of overdosing and crashing her car 2 weeks ago.  She has no history of any past attempts or any OP or IP treatment. Pt has been sleeping through class (15 hrs daily) and so she now will not graduate on time and has to stay and take summer classes to graduate.  Pt's mom notes that pt just broke up with her BF, but pt denies that is causing a problem. She reports less interest in hanging out with friends, and increased anxiety, with a panic attack two weeks ago. She says that in 2013, she had an episode of pseudo seizures with depression, but never got treatment.  Pt denies Current SI, HI, AVH or SA. She was oriented x4. Pt was calm and cooperative during assessment and she requested that her mom stay with her. She made poor eye contact and had depressed mood and sad affect.  She had norrmal thought content, speech and movement. Pt does not want IP admission, and says she can stay safe and will be with her mom this weekend.  Dr. Jama Flavorsobos recommends pt referral to [facility] partial OP program. Writer gave information and they will call Monday morning for an appt.  Pt's mom agrees with disposition.  Axis I: Major Depression, Recurrent severe Axis II: Deferred Axis III:  Past Medical History  Diagnosis Date  . Mitral valve prolapse   . Scoliosis   . Sickle cell trait   . Ovarian cyst   . Glaucoma    Axis IV: other psychosocial or environmental problems Axis V: 41-50 serious symptoms  Past Medical History:  Past Medical History  Diagnosis Date  .  Mitral valve prolapse   . Scoliosis   . Sickle cell trait   . Ovarian cyst   . Glaucoma     No past surgical history on file.  Family History:  Family History  Problem Relation Age of Onset  . Hypertension Mother   . Diabetes Other   . Hypertension Other   . Cancer Other     Social History:  reports that she has never smoked. She does not have any smokeless tobacco history on file. She reports that she drinks alcohol. She reports that she does not use illicit drugs.  Additional Social History:  Alcohol / Drug Use Pain Medications: denies Prescriptions: denies Over the Counter: denies History of alcohol / drug use?: No history of alcohol / drug abuse Longest period of sobriety (when/how long): denies Negative Consequences of Use:  (denies) Withdrawal Symptoms:  (denies)  CIWA:   COWS:    PATIENT STRENGTHS: (choose at least two) Ability for insight Average or above average intelligence Capable of independent living Communication skills Financial means General fund of knowledge Motivation for treatment/growth Physical Health Special hobby/interest Supportive family/friends Work skills  Allergies:  Allergies  Allergen Reactions  . Corn-Containing Products Anaphylaxis and Hives    Home Medications:  No prescriptions prior to admission    OB/GYN Status:  No LMP recorded.  General  Assessment Data Location of Assessment: [facility] Services TTS Assessment: In system Is this a Tele or Face-to-Face Assessment?: Face-to-Face Is this an Initial Assessment or a Re-assessment for this encounter?: Initial Assessment Marital status: Single Is patient pregnant?: Unknown Pregnancy Status: Unknown Living Arrangements:  (roomate) Can pt return to current living arrangement?: Yes Admission Status: Voluntary Is patient capable of signing voluntary admission?: Yes Referral Source: Self/Family/Friend Insurance type:  (private)  Medical Screening Exam [facility] Walk-in  ONLY) Medical Exam completed: No Reason for MSE not completed: Patient Refused  Crisis Care Plan Living Arrangements:  (roomate) Name of Psychiatrist:  (none) Name of Therapist:  (none)  Education Status Is patient currently in school?: Yes Current Grade: senior college Highest grade of school patient has completed: junior Name of school: [employer]  Risk to self with the past 6 months Suicidal Ideation: No-Not Currently/Within Last 6 Months Has patient been a risk to self within the past 6 months prior to admission? : Other (comment) (had SI 2 weeks ago) Suicidal Intent: No Has patient had any suicidal intent within the past 6 months prior to admission? : No Is patient at risk for suicide?: Yes Suicidal Plan?: No-Not Currently/Within Last 6 Months Has patient had any suicidal plan within the past 6 months prior to admission? : Yes Access to Means: Yes Specify Access to Suicidal Means:  (car, pills) What has been your use of drugs/alcohol within the last 12 months?:  (none) Previous Attempts/Gestures: No Other Self Harm Risks:  (none known) Intentional Self Injurious Behavior: None Family Suicide History: No Recent stressful life event(s):  (school, relationship) Persecutory voices/beliefs?: No Depression: Yes Depression Symptoms: Tearfulness, Isolating, Fatigue, Guilt, Loss of interest in usual pleasures, Feeling angry/irritable, Feeling worthless/self pity Substance abuse history and/or treatment for substance abuse?: No Suicide prevention information given to non-admitted patients: Yes  Risk to Others within the past 6 months Homicidal Ideation: No Does patient have any lifetime risk of violence toward others beyond the six months prior to admission? : No Thoughts of Harm to Others: No Current Homicidal Intent: No Current Homicidal Plan: No Access to Homicidal Means: No History of harm to others?: No Assessment of Violence: None Noted Does patient have access to  weapons?: No Criminal Charges Pending?: No Does patient have a court date: No Is patient on probation?: No  Psychosis Hallucinations: None noted Delusions: None noted  Mental Status Report Appearance/Hygiene: Unremarkable Eye Contact: Fair Motor Activity: Unremarkable Speech: Logical/coherent Level of Consciousness: Alert Mood: Depressed, Sad Affect: Depressed, Sad Anxiety Level: Panic Attacks Panic attack frequency: 1x 8mo Most recent panic attack:  (last week) Thought Processes: Coherent, Relevant Judgement: Unimpaired Orientation: Person, Place, Time, Situation, Appropriate for developmental age Obsessive Compulsive Thoughts/Behaviors: None  Cognitive Functioning Concentration: Fair Memory: Recent Intact, Remote Intact IQ: Average Insight: Fair Impulse Control: Fair Appetite: Good Weight Loss: 0 Weight Gain: 0 Sleep: Increased Total Hours of Sleep: 15 Vegetative Symptoms: None  ADLScreening [facility] Assessment Services) Patient's cognitive ability adequate to safely complete daily activities?: Yes Patient able to express need for assistance with ADLs?: Yes Independently performs ADLs?: Yes (appropriate for developmental age)  Prior Inpatient Therapy Prior Inpatient Therapy: No  Prior Outpatient Therapy Prior Outpatient Therapy: No Does patient have an ACCT team?: No Does patient have Intensive In-House Services?  : No Does patient have [facility] services? : No Does patient have P4CC services?: No  ADL Screening (condition at time of admission) Patient's cognitive ability adequate to safely complete daily activities?: Yes Is the patient  deaf or have difficulty hearing?: No Does the patient have difficulty seeing, even when wearing glasses/contacts?: No Does the patient have difficulty concentrating, remembering, or making decisions?: No Patient able to express need for assistance with ADLs?: Yes Does the patient have difficulty dressing or bathing?:  No Independently performs ADLs?: Yes (appropriate for developmental age) Does the patient have difficulty walking or climbing stairs?: No Weakness of Legs: None Weakness of Arms/Hands: None  Home Assistive Devices/Equipment Home Assistive Devices/Equipment: None    Abuse/Neglect Assessment (Assessment to be complete while patient is alone) Physical Abuse: Denies Verbal Abuse: Denies Sexual Abuse: Denies Exploitation of patient/patient's resources: Denies Self-Neglect: Denies Values / Beliefs Cultural Requests During Hospitalization: None Spiritual Requests During Hospitalization: None   Advance Directives (For Healthcare) Does patient have an advance directive?: No Would patient like information on creating an advanced directive?: No - patient declined information    Additional Information 1:1 In Past 12 Months?: No CIRT Risk: No Elopement Risk: No Does patient have medical clearance?: No     Disposition:  Disposition Initial Assessment Completed for this Encounter: Yes Disposition of Patient: Outpatient treatment (Partial) Type of outpatient treatment: Adult (partial)  Teleah Villamar Hines 07/07/2014 6:44 PM

## 2014-08-22 ENCOUNTER — Emergency Department (HOSPITAL_COMMUNITY): Payer: No Typology Code available for payment source

## 2014-08-22 ENCOUNTER — Emergency Department (HOSPITAL_COMMUNITY)
Admission: EM | Admit: 2014-08-22 | Discharge: 2014-08-22 | Disposition: A | Discharge status: Home / Self Care | Payer: No Typology Code available for payment source | Attending: Emergency Medicine | Admitting: Emergency Medicine

## 2014-08-22 ENCOUNTER — Encounter (HOSPITAL_COMMUNITY): Payer: Self-pay

## 2014-08-22 DIAGNOSIS — M419 Scoliosis, unspecified: Secondary | ICD-10-CM | POA: Insufficient documentation

## 2014-08-22 DIAGNOSIS — N898 Other specified noninflammatory disorders of vagina: Secondary | ICD-10-CM | POA: Diagnosis present

## 2014-08-22 DIAGNOSIS — R1030 Lower abdominal pain, unspecified: Secondary | ICD-10-CM

## 2014-08-22 DIAGNOSIS — Z79899 Other long term (current) drug therapy: Secondary | ICD-10-CM | POA: Diagnosis not present

## 2014-08-22 DIAGNOSIS — N766 Ulceration of vulva: Secondary | ICD-10-CM | POA: Diagnosis not present

## 2014-08-22 DIAGNOSIS — B9689 Other specified bacterial agents as the cause of diseases classified elsewhere: Secondary | ICD-10-CM

## 2014-08-22 DIAGNOSIS — Z8669 Personal history of other diseases of the nervous system and sense organs: Secondary | ICD-10-CM | POA: Diagnosis not present

## 2014-08-22 DIAGNOSIS — N760 Acute vaginitis: Secondary | ICD-10-CM

## 2014-08-22 DIAGNOSIS — Z862 Personal history of diseases of the blood and blood-forming organs and certain disorders involving the immune mechanism: Secondary | ICD-10-CM | POA: Insufficient documentation

## 2014-08-22 DIAGNOSIS — N730 Acute parametritis and pelvic cellulitis: Secondary | ICD-10-CM

## 2014-08-22 DIAGNOSIS — N949 Unspecified condition associated with female genital organs and menstrual cycle: Secondary | ICD-10-CM

## 2014-08-22 DIAGNOSIS — Z3202 Encounter for pregnancy test, result negative: Secondary | ICD-10-CM | POA: Diagnosis not present

## 2014-08-22 DIAGNOSIS — R102 Pelvic and perineal pain: Secondary | ICD-10-CM

## 2014-08-22 LAB — URINALYSIS, ROUTINE W REFLEX MICROSCOPIC
BILIRUBIN URINE: NEGATIVE
GLUCOSE, UA: NEGATIVE mg/dL
KETONES UR: NEGATIVE mg/dL
Nitrite: NEGATIVE
PH: 7 (ref 5.0–8.0)
Protein, ur: NEGATIVE mg/dL
Specific Gravity, Urine: 1.012 (ref 1.005–1.030)
Urobilinogen, UA: 0.2 mg/dL (ref 0.0–1.0)

## 2014-08-22 LAB — WET PREP, GENITAL
Trich, Wet Prep: NONE SEEN
Yeast Wet Prep HPF POC: NONE SEEN

## 2014-08-22 LAB — URINE MICROSCOPIC-ADD ON

## 2014-08-22 LAB — PREGNANCY, URINE: PREG TEST UR: NEGATIVE

## 2014-08-22 LAB — HIV ANTIBODY (ROUTINE TESTING W REFLEX): HIV Screen 4th Generation wRfx: NONREACTIVE

## 2014-08-22 LAB — RPR: RPR: NONREACTIVE

## 2014-08-22 MED ORDER — DOXYCYCLINE HYCLATE 100 MG PO CAPS: 100.0000 mg | ORAL_CAPSULE | Freq: Two times a day (BID) | ORAL | Status: AC

## 2014-08-22 MED ORDER — METRONIDAZOLE 500 MG PO TABS: 500.0000 mg | ORAL_TABLET | Freq: Two times a day (BID) | ORAL | Status: AC

## 2014-08-22 MED ORDER — CEFTRIAXONE SODIUM 250 MG IJ SOLR
250.0000 mg | Freq: Once | INTRAMUSCULAR | Status: AC
  Administered 2014-08-22: 250 mg via INTRAMUSCULAR
  Filled 2014-08-22: qty 250

## 2014-08-22 MED ORDER — STERILE WATER FOR INJECTION IJ SOLN
INTRAMUSCULAR | Status: AC
  Administered 2014-08-22: 10 mL
  Filled 2014-08-22: qty 10

## 2014-08-22 MED ORDER — ACYCLOVIR 400 MG PO TABS: 400.0000 mg | ORAL_TABLET | Freq: Three times a day (TID) | ORAL | Status: AC

## 2014-08-22 MED ORDER — AZITHROMYCIN 250 MG PO TABS
1000.0000 mg | ORAL_TABLET | Freq: Once | ORAL | Status: AC
  Administered 2014-08-22: 1000 mg via ORAL
  Filled 2014-08-22: qty 4

## 2014-08-22 NOTE — ED Provider Notes (Signed)
CSN: [id number]     Arrival date & time 08/22/14  0443 History   First MD Initiated Contact with Patient 08/22/14 [id number]     Chief Complaint  Patient presents with  . Vaginal Discharge     (Consider location/radiation/quality/duration/timing/severity/associated sxs/prior Treatment) HPI Comments: Mallory Dickerson is a 24 y.o. female with a PMHx of mitral valve prolapse, sickle cell trait, ovarian cysts, glaucoma, and scoliosis, who presents to the ED with complaints of one week of dysuria, milky yellow vaginal discharge, and genital lesions on the labia which appear to be reddish pimples. She also endorses suprapubic abdominal pain which she describes as 4/10 constant nonradiating sore and achy pain worse with palpation and with no treatments tried prior to arrival. She is sexually active with one female partner, unprotected. She denies any vaginal itching, fevers, chills, chest pain, shortness of breath, nausea, vomiting, diarrhea, constipation, hematuria, malodorous urine, urinary frequency or urgency, numbness, tingling, or weakness. LMP 08/04/14.  Patient is a 24 y.o. female presenting with vaginal discharge. The history is provided by the patient. No language interpreter was used.  Vaginal Discharge Quality:  Milky and yellow Severity:  Moderate Onset quality:  Gradual Duration:  1 week Timing:  Constant Progression:  Unchanged Chronicity:  New Context: spontaneously   Relieved by:  None tried Worsened by:  Nothing tried Ineffective treatments:  None tried Associated symptoms: abdominal pain (suprapubic), dysuria and genital lesions   Associated symptoms: no fever, no nausea, no urinary frequency, no vaginal itching and no vomiting   Risk factors: unprotected sex     Past Medical History  Diagnosis Date  . Mitral valve prolapse   . Scoliosis   . Sickle cell trait   . Ovarian cyst   . Glaucoma    History reviewed. No pertinent past surgical history. Family History  Problem Relation  Age of Onset  . Hypertension Mother   . Diabetes Other   . Hypertension Other   . Cancer Other    History  Substance Use Topics  . Smoking status: Never Smoker   . Smokeless tobacco: Not on file  . Alcohol Use: Yes     Comment: occ   OB History    No data available     Review of Systems  Constitutional: Negative for fever and chills.  Respiratory: Negative for shortness of breath.   Cardiovascular: Negative for chest pain.  Gastrointestinal: Positive for abdominal pain (suprapubic). Negative for nausea, vomiting, diarrhea and constipation.  Genitourinary: Positive for dysuria, vaginal discharge, genital sores and vaginal pain. Negative for hematuria, flank pain and vaginal bleeding.  Musculoskeletal: Negative for myalgias and arthralgias.  Skin: Negative for color change.  Allergic/Immunologic: Negative for immunocompromised state.  Neurological: Negative for weakness and numbness.  Psychiatric/Behavioral: Negative for confusion.   10 Systems reviewed and are negative for acute change except as noted in the HPI.    Allergies  Corn-containing products  Home Medications   Prior to Admission medications   Medication Sig Start Date End Date Taking? Authorizing Provider  azithromycin (ZITHROMAX) 250 MG tablet Take 1 tablet (250 mg total) by mouth daily. Take first 2 tablets together, then 1 every day until finished. Patient not taking: Reported on 08/22/2014 05/20/14   Raymon Mutton, PA-C  DM-Doxylamine-Acetaminophen (NYQUIL COLD & FLU PO) Take 30 mLs by mouth at bedtime as needed (cough).    Historical Provider, MD  Multiple Vitamins-Minerals (AIRBORNE) CHEW Chew 2 tablets by mouth 2 (two) times daily.    Historical Provider, MD  Pseudoephedrine-APAP-DM (DAYQUIL MULTI-SYMPTOM COLD/FLU PO) Take 30 mLs by mouth every 6 (six) hours as needed (cough).    Historical Provider, MD   BP 134/93 mmHg  Pulse 99  Temp(Src) 98.4 F (36.9 C) (Oral)  Resp 18  Ht 5\' 2"  (1.575 m)  Wt  130 lb (58.968 kg)  BMI 23.77 kg/m2  SpO2 100%  LMP 08/04/2014 Physical Exam  Constitutional: She is oriented to person, place, and time. Vital signs are normal. She appears well-developed and well-nourished.  Non-toxic appearance. No distress.  Afebrile, nontoxic, NAD  HENT:  Head: Normocephalic and atraumatic.  Mouth/Throat: Oropharynx is clear and moist and mucous membranes are normal.  Eyes: Conjunctivae and EOM are normal. Right eye exhibits no discharge. Left eye exhibits no discharge.  Neck: Normal range of motion. Neck supple.  Cardiovascular: Normal rate, regular rhythm, normal heart sounds and intact distal pulses.  Exam reveals no gallop and no friction rub.   No murmur heard. Pulmonary/Chest: Effort normal and breath sounds normal. No respiratory distress. She has no decreased breath sounds. She has no wheezes. She has no rhonchi. She has no rales.  Abdominal: Soft. Normal appearance and bowel sounds are normal. She exhibits no distension. There is tenderness in the suprapubic area. There is no rigidity, no rebound, no guarding, no CVA tenderness, no tenderness at McBurney's point and negative Murphy's sign.    Soft, nondistended, +BS throughout, with very trace suprapubic tenderness near pelvic brim, no r/g/r, neg murphy's, neg mcburney's, no CVA TTP   Genitourinary: Uterus normal. Pelvic exam was performed with patient supine. There is tenderness and lesion on the right labia. There is no rash on the right labia. There is tenderness and lesion on the left labia. There is no rash on the left labia. Cervix exhibits motion tenderness and discharge. Cervix exhibits no friability. Right adnexum displays tenderness. Right adnexum displays no mass and no fullness. Left adnexum displays no mass, no tenderness and no fullness. No erythema, tenderness or bleeding in the vagina. Vaginal discharge found.  Chaperone present for exam. Multiple small vesicular lesions to b/l labia, one intact to  the L labia majora, and multiple unroofed erythematous based lesions to b/l labia minora extending towards urethra which are TTP. No erythema, injury, or tenderness to vaginal mucosa. Thin yellowish discharge without bleeding within vaginal vault. No adnexal masses or fullness, but some R adnexal tenderness. +CMT and yellowish discharge from cervical os without cervical friability. Uterus non-deviated, mobile, nonTTP, and without enlargement.    Musculoskeletal: Normal range of motion.  Neurological: She is alert and oriented to person, place, and time. She has normal strength. No sensory deficit.  Skin: Skin is warm, dry and intact. No rash noted.  Psychiatric: She has a normal mood and affect.  Nursing note and vitals reviewed.   ED Course  Procedures (including critical care time) Labs Review Labs Reviewed  WET PREP, GENITAL - Abnormal; Notable for the following:    Clue Cells Wet Prep HPF POC FEW (*)    WBC, Wet Prep HPF POC FEW (*)    All other components within normal limits  URINALYSIS, ROUTINE W REFLEX MICROSCOPIC (NOT AT [facility]) - Abnormal; Notable for the following:    APPearance CLOUDY (*)    Hgb urine dipstick TRACE (*)    Leukocytes, UA TRACE (*)    All other components within normal limits  URINE MICROSCOPIC-ADD ON - Abnormal; Notable for the following:    Squamous Epithelial / LPF FEW (*)  All other components within normal limits  HERPES SIMPLEX VIRUS CULTURE  PREGNANCY, URINE  RPR  HIV ANTIBODY (ROUTINE TESTING)  GC/CHLAMYDIA PROBE AMP ([facility]) NOT AT [facility]    Imaging Review US Transvaginal Non-ob  08/22/2014   CLINICAL DATA:  Vaginal discharge and pain  EXAM: TRANSABDOMINAL AND TRANSVAGINAL ULTRASOUND OF PELVIS  DOPPLER ULTRASOUND OF OVARIES  TECHNIQUE: Study was performed transabdominally to optimize pelvic field of view evaluation and transvaginally to optimize internal visceral architecture evaluation.  Color and duplex Doppler ultrasound was utilized to  evaluate blood flow to the ovaries.  COMPARISON:  None.  FINDINGS: Uterus  Measurements: 5.6 x 2.8 x 3.4 cm. No fibroids or other mass visualized.  Endometrium  Thickness: 7 mm.  No focal abnormality visualized.  Right ovary  Measurements: 2.8 x 2.0 x 3.1 cm. Normal appearance/no adnexal mass.  Left ovary  Measurements: 2.8 x 1.7 x 3.0 cm. Normal appearance/no adnexal mass.  Pulsed Doppler evaluation of both ovaries demonstrates normal low-resistance arterial and venous waveforms. The peak systolic velocity in the right ovary is 7 cm/sec. The peak systolic velocity in the left ovary is 10 cm/sec.  Other findings  Trace free fluid.  IMPRESSION: Trace free pelvic fluid may be physiologic but could also represent recent ovarian cyst rupture. There is no intrauterine or extrauterine pelvic or adnexal mass. No evidence of ovarian torsion. No inflammatory focus appreciable.   Electronically Signed   By: Bretta Bang III M.D.   On: 08/22/2014 08:33   US Pelvis Complete  08/22/2014   CLINICAL DATA:  Vaginal discharge and pain  EXAM: TRANSABDOMINAL AND TRANSVAGINAL ULTRASOUND OF PELVIS  DOPPLER ULTRASOUND OF OVARIES  TECHNIQUE: Study was performed transabdominally to optimize pelvic field of view evaluation and transvaginally to optimize internal visceral architecture evaluation.  Color and duplex Doppler ultrasound was utilized to evaluate blood flow to the ovaries.  COMPARISON:  None.  FINDINGS: Uterus  Measurements: 5.6 x 2.8 x 3.4 cm. No fibroids or other mass visualized.  Endometrium  Thickness: 7 mm.  No focal abnormality visualized.  Right ovary  Measurements: 2.8 x 2.0 x 3.1 cm. Normal appearance/no adnexal mass.  Left ovary  Measurements: 2.8 x 1.7 x 3.0 cm. Normal appearance/no adnexal mass.  Pulsed Doppler evaluation of both ovaries demonstrates normal low-resistance arterial and venous waveforms. The peak systolic velocity in the right ovary is 7 cm/sec. The peak systolic velocity in the left ovary is 10  cm/sec.  Other findings  Trace free fluid.  IMPRESSION: Trace free pelvic fluid may be physiologic but could also represent recent ovarian cyst rupture. There is no intrauterine or extrauterine pelvic or adnexal mass. No evidence of ovarian torsion. No inflammatory focus appreciable.   Electronically Signed   By: Bretta Bang III M.D.   On: 08/22/2014 08:33   [place] Art/ven Flow Abd Pelv Doppler  08/22/2014   CLINICAL DATA:  Vaginal discharge and pain  EXAM: TRANSABDOMINAL AND TRANSVAGINAL ULTRASOUND OF PELVIS  DOPPLER ULTRASOUND OF OVARIES  TECHNIQUE: Study was performed transabdominally to optimize pelvic field of view evaluation and transvaginally to optimize internal visceral architecture evaluation.  Color and duplex Doppler ultrasound was utilized to evaluate blood flow to the ovaries.  COMPARISON:  None.  FINDINGS: Uterus  Measurements: 5.6 x 2.8 x 3.4 cm. No fibroids or other mass visualized.  Endometrium  Thickness: 7 mm.  No focal abnormality visualized.  Right ovary  Measurements: 2.8 x 2.0 x 3.1 cm. Normal appearance/no adnexal mass.  Left ovary  Measurements: 2.8 x 1.7 x 3.0 cm. Normal appearance/no adnexal mass.  Pulsed Doppler evaluation of both ovaries demonstrates normal low-resistance arterial and venous waveforms. The peak systolic velocity in the right ovary is 7 cm/sec. The peak systolic velocity in the left ovary is 10 cm/sec.  Other findings  Trace free fluid.  IMPRESSION: Trace free pelvic fluid may be physiologic but could also represent recent ovarian cyst rupture. There is no intrauterine or extrauterine pelvic or adnexal mass. No evidence of ovarian torsion. No inflammatory focus appreciable.   Electronically Signed   By: Bretta Bang III M.D.   On: 08/22/2014 08:33     EKG Interpretation None      MDM   Final diagnoses:  Right adnexal tenderness  Lower abdominal pain  PID (acute pelvic inflammatory disease)  Vaginal discharge  BV (bacterial vaginosis)  Genital  lesion, female    24 y.o. female here with vaginal lesions, vaginal discharge, and dysuria x1wk. Some mild suprapubic tenderness on exam, nonperitoneal exam. Urine showing some contamination but no signs of UTI based on nitrite neg, trace leuks, rare bacteria and few squamous. Upreg neg. Sexually active with 1 partner, unprotected. Will perform pelvic exam and decide if imaging is needed, doubt that labs are needed since abd tenderness is very mild and seems more likely to be pelvic in origin. Will reassess shortly.   7:03 AM Pelvic reveals herpetic lesions near urethra and on the labia, likely causing the discomfort with urination. Obtained culture swab, but will empirically treat. +CMT and yellow cervical discharge, will empirically treat for GC/CT and likely continue tx for PID. Some R sided adnexal tenderness, will proceed with pelvic u/s. Will reassess shortly. Pt declines wanting any pain medications at this time.  8:43 AM  Wet prep showing few clue cells, given pt's symptoms will treat for BV. U/S reveals trace free fluid, likely from recent ovulation since pt is ~2wks out from LMP. 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 Discussed use of sitz baths to help with pain. Discussed use of condoms. Discussed tylenol/motrin PRN pain. Will have her f/up with her OBGYN in 1wk. I explained the diagnosis and have given explicit precautions to return to the ER including for any other new or worsening symptoms. The patient understands and accepts the medical plan as it's been dictated and I have answered their questions. Discharge instructions concerning home care and prescriptions have been given. The patient is STABLE and is discharged to home in good condition.  BP 122/79 mmHg  Pulse 76  Temp(Src) 98.4 F (36.9 C) (Oral)  Resp 18  Ht 5\' 2"  (1.575 m)  Wt 130 lb (58.968 kg)  BMI 23.77 kg/m2  SpO2 100%  LMP 08/04/2014  Meds ordered this encounter  Medications    . azithromycin (ZITHROMAX) tablet 1,000 mg    Sig:    And  . cefTRIAXone (ROCEPHIN) injection 250 mg    Sig:     Order Specific Question:  Antibiotic Indication:    Answer:  STD  . sterile water (preservative free) injection    Sig:     Sharene Skeans   : cabinet override  . acyclovir (ZOVIRAX) 400 MG tablet    Sig: Take 1 tablet (400 mg total) by mouth 3 (three) times daily. X 10 days    Dispense:  30 tablet    Refill:  0    Order Specific Question:  Supervising Provider    Answer:  Eber Hong [  [id number]]  . doxycycline (VIBRAMYCIN) 100 MG capsule    Sig: Take 1 capsule (100 mg total) by mouth 2 (two) times daily. One po bid x 14 days    Dispense:  28 capsule    Refill:  0    Order Specific Question:  Supervising Provider    Answer:  MILLER, BRIAN [[id number]]  . metroNIDAZOLE (FLAGYL) 500 MG tablet    Sig: Take 1 tablet (500 mg total) by mouth 2 (two) times daily. One po bid x 7 days    Dispense:  14 tablet    Refill:  0    Order Specific Question:  Supervising Provider    Answer:  Eber Hong [[id number]]     Khushboo Chuck Camprubi-Soms, PA-C 08/22/14 2130  Paula Libra, MD 08/22/14 2239

## 2014-08-22 NOTE — Discharge Instructions (Signed)
You have been treated for gonorrhea and chlamydia in the ER but the hospital will call you if lab is positive. You were tested for HIV and Syphilis, and the hospital will call you if the lab is positive.  You have been sent home with treatment for herpes, take acyclovir as directed. The lab will call you if your herpes culture returns positive.  You are being treated for possible pelvic inflammatory disease, which could be the cause of your symptoms, take the antibiotic doxycycline as directed and avoid direct sunlight exposure.  Your vaginal swab also showed bacterial vaginosis, take flagyl as directed but do not consume alcohol while taking this medication.  Always practice safe sex using condoms with every sexual encounter. Use tylenol or motrin as needed for pain, and you may consider performing warm sitz baths to help with the pain from your genital lesions.  Follow up with your regular OBGYN in 1 week for recheck of symptoms. Return to the ER for changes or worsening symptoms.   Bacterial Vaginosis Bacterial vaginosis is an infection of the vagina. It happens when too many of certain germs (bacteria) grow in the vagina. HOME CARE  Take your medicine as told by your doctor.  Finish your medicine even if you start to feel better.  Do not have sex until you finish your medicine and are better.  Tell your sex partner that you have an infection. They should see their doctor for treatment.  Practice safe sex. Use condoms. Have only one sex partner. GET HELP IF:  You are not getting better after 3 days of treatment.  You have more grey fluid (discharge) coming from your vagina than before.  You have more pain than before.  You have a fever. MAKE SURE YOU:   Understand these instructions.  Will watch your condition.  Will get help right away if you are not doing well or get worse. Document Released: 11/27/2007 Document Revised: 12/08/2012 Document Reviewed: 09/29/2012 [facility]  Patient Information 2015 [place], [place]. This information is not intended to replace advice given to you by your health care provider. Make sure you discuss any questions you have with your health care provider.  Pelvic Inflammatory Disease Pelvic inflammatory disease (PID) refers to an infection in some or all of the female organs. The infection can be in the uterus, ovaries, fallopian tubes, or the surrounding tissues in the pelvis. PID can cause abdominal or pelvic pain that comes on suddenly (acute pelvic pain). PID is a serious infection because it can lead to lasting (chronic) pelvic pain or the inability to have children (infertile).  CAUSES  The infection is often caused by the normal bacteria found in the vaginal tissues. PID may also be caused by an infection that is spread during sexual contact. PID can also occur following:   The birth of a baby.   A miscarriage.   An abortion.   Major pelvic surgery.   The use of an intrauterine device (IUD).   A sexual assault.  RISK FACTORS Certain factors can put a person at higher risk for PID, such as:  Being younger than 25 years.  Being sexually active at [place] age.  Usingnonbarrier contraception.  Havingmultiple sexual partners.  Having sex with someone who has symptoms of a genital infection.  Using oral contraception. Other times, certain behaviors can increase the possibility of getting PID, such as:  Having sex during your period.  Using a vaginal douche.  Having an intrauterine device (IUD) in place. SYMPTOMS  Abdominal or pelvic pain.   Fever.   Chills.   Abnormal vaginal discharge.  Abnormal uterine bleeding.   Unusual pain shortly after finishing your period. DIAGNOSIS  Your caregiver will choose some of the following methods to make a diagnosis, such as:   Performinga physical exam and history. A pelvic exam typically reveals a very tender uterus and surrounding pelvis.   Ordering  laboratory tests including a pregnancy test, blood tests, and urine test.  Orderingcultures of the vagina and cervix to check for a sexually transmitted infection (STI).  Performing an ultrasound.   Performing a laparoscopic procedure to look inside the pelvis.  TREATMENT   Antibiotic medicines may be prescribed and taken by mouth.   Sexual partners may be treated when the infection is caused by a sexually transmitted disease (STD).   Hospitalization may be needed to give antibiotics intravenously.  Surgery may be needed, but this is rare. It may take weeks until you are completely well. If you are diagnosed with PID, you should also be checked for human immunodeficiency virus (HIV). HOME CARE INSTRUCTIONS   If given, take your antibiotics as directed. Finish the medicine even if you start to feel better.   Only take over-the-counter or prescription medicines for pain, discomfort, or fever as directed by your caregiver.   Do not have sexual intercourse until treatment is completed or as directed by your caregiver. If PID is confirmed, your recent sexual partner(s) will need treatment.   Keep your follow-up appointments. SEEK MEDICAL CARE IF:   You have increased or abnormal vaginal discharge.   You need prescription medicine for your pain.   You vomit.   You cannot take your medicines.   Your partner has an STD.  SEEK IMMEDIATE MEDICAL CARE IF:   You have a fever.   You have increased abdominal or pelvic pain.   You have chills.   You have pain when you urinate.   You are not better after 72 hours following treatment.  MAKE SURE YOU:   Understand these instructions.  Will watch your condition.  Will get help right away if you are not doing well or get worse. Document Released: 02/17/2005 Document Revised: 06/14/2012 Document Reviewed: 02/13/2011 [facility] Patient Information 2015 [place], [place]. This information is not intended to replace  advice given to you by your health care provider. Make sure you discuss any questions you have with your health care provider.

## 2014-08-22 NOTE — ED Notes (Signed)
Pt complains of painful urination and vaginal discharge for one week

## 2014-08-23 LAB — GC/CHLAMYDIA PROBE AMP (~~LOC~~) NOT AT ARMC
Chlamydia: NEGATIVE
Neisseria Gonorrhea: NEGATIVE

## 2014-08-24 LAB — HERPES SIMPLEX VIRUS CULTURE
Culture: DETECTED
Special Requests: NORMAL

## 2014-08-26 ENCOUNTER — Telehealth: Payer: Self-pay | Admitting: Emergency Medicine

## 2014-08-27 ENCOUNTER — Telehealth: Payer: Self-pay | Admitting: Emergency Medicine

## 2014-08-30 ENCOUNTER — Telehealth (HOSPITAL_COMMUNITY): Payer: Self-pay

## 2016-04-08 ENCOUNTER — Emergency Department (HOSPITAL_COMMUNITY)
Admission: EM | Admit: 2016-04-08 | Discharge: 2016-04-08 | Disposition: A | Discharge status: Home / Self Care | Payer: No Typology Code available for payment source | Attending: Physician Assistant | Admitting: Physician Assistant

## 2016-04-08 ENCOUNTER — Encounter (HOSPITAL_COMMUNITY): Payer: Self-pay | Admitting: Emergency Medicine

## 2016-04-08 DIAGNOSIS — J020 Streptococcal pharyngitis: Secondary | ICD-10-CM | POA: Insufficient documentation

## 2016-04-08 LAB — RAPID STREP SCREEN (MED CTR MEBANE ONLY): Streptococcus, Group A Screen (Direct): POSITIVE — AB

## 2016-04-08 MED ORDER — PENICILLIN G BENZATHINE 1200000 UNIT/2ML IM SUSP
1.2000 10*6.[IU] | Freq: Once | INTRAMUSCULAR | Status: AC
  Administered 2016-04-08: 1.2 10*6.[IU] via INTRAMUSCULAR
  Filled 2016-04-08: qty 2

## 2016-04-08 NOTE — ED Triage Notes (Signed)
Patient reports sore throat, pain with swallowing, and lymph node swelling since Sunday. Patient speaking in full sentences and able to swallow. Patient was dx last month with strep throat.

## 2016-04-08 NOTE — ED Provider Notes (Signed)
[facility]-EMERGENCY DEPT Provider Note   CSN: [id number] Arrival date & time: 04/08/16  1451     History   Chief Complaint Chief Complaint  Patient presents with  . Sore Throat    HPI Mallory Dickerson is a 26 y.o. female.  The history is provided by the patient and medical records.    26 year old female with history of MVP, scoliosis, sickle cell trait, presenting to the ED for sore throat which began 2 days ago. She reports pain with swallowing and scratchy sensation in her throat. She denies any fever. No difficulty swallowing. No chest pain or shortness of breath. No nausea or vomiting. Reports she [place] to visit her friend and got sick abruptly after. States she had strep throat in January as well after traveling to [place]. She has never seen an ENT previously.  Past Medical History:  Diagnosis Date  . Glaucoma   . Mitral valve prolapse   . Ovarian cyst   . Scoliosis   . Sickle cell trait (HCC)     There are no active problems to display for this patient.   History reviewed. No pertinent surgical history.  OB History    No data available       Home Medications    Prior to Admission medications   Medication Sig Start Date End Date Taking? Authorizing Provider  acyclovir (ZOVIRAX) 400 MG tablet Take 1 tablet (400 mg total) by mouth 3 (three) times daily. X 10 days 08/22/14   Mercedes Street, PA-C  azithromycin (ZITHROMAX) 250 MG tablet Take 1 tablet (250 mg total) by mouth daily. Take first 2 tablets together, then 1 every day until finished. Patient not taking: Reported on 08/22/2014 05/20/14   Raymon Mutton, PA-C  DM-Doxylamine-Acetaminophen (NYQUIL COLD & FLU PO) Take 30 mLs by mouth at bedtime as needed (cough).    Historical Provider, MD  doxycycline (VIBRAMYCIN) 100 MG capsule Take 1 capsule (100 mg total) by mouth 2 (two) times daily. One po bid x 14 days 08/22/14   Mercedes Street, PA-C  metroNIDAZOLE (FLAGYL) 500 MG tablet Take 1 tablet  (500 mg total) by mouth 2 (two) times daily. One po bid x 7 days 08/22/14   Kellogg, PA-C  Multiple Vitamins-Minerals (AIRBORNE) CHEW Chew 2 tablets by mouth 2 (two) times daily.    Historical Provider, MD  Pseudoephedrine-APAP-DM (DAYQUIL MULTI-SYMPTOM COLD/FLU PO) Take 30 mLs by mouth every 6 (six) hours as needed (cough).    Historical Provider, MD    Family History Family History  Problem Relation Age of Onset  . Hypertension Mother   . Diabetes Other   . Hypertension Other   . Cancer Other     Social History Social History  Substance Use Topics  . Smoking status: Never Smoker  . Smokeless tobacco: Never Used  . Alcohol use Yes     Comment: occ     Allergies   Corn-containing products   Review of Systems Review of Systems  HENT: Positive for sore throat.   All other systems reviewed and are negative.    Physical Exam Updated Vital Signs BP 134/89   Pulse (!) 121   Temp 98.7 F (37.1 C)   Resp 16   Ht 5\' 3"  (1.6 m)   Wt 59 kg   LMP 03/12/2016   SpO2 94%   BMI 23.03 kg/m   Physical Exam  Constitutional: She is oriented to person, place, and time. She appears well-developed and well-nourished.  HENT:  Head: Normocephalic and atraumatic.  Mouth/Throat: Oropharynx is clear and moist.  Tonsils 1+ bilaterally with small exudates noted; uvula midline without evidence of peritonsillar abscess; handling secretions appropriately; no difficulty swallowing or speaking; normal phonation without stridor  Eyes: Conjunctivae and EOM are normal. Pupils are equal, round, and reactive to light.  Neck: Normal range of motion.  Cardiovascular: Normal rate, regular rhythm and normal heart sounds.   Pulmonary/Chest: Effort normal and breath sounds normal. No respiratory distress. She has no wheezes.  Abdominal: Soft. Bowel sounds are normal. There is no tenderness. There is no rebound.  Musculoskeletal: Normal range of motion.  Neurological: She is alert and oriented  to person, place, and time.  Skin: Skin is warm and dry.  Psychiatric: She has a normal mood and affect.  Nursing note and vitals reviewed.    ED Treatments / Results  Labs (all labs ordered are listed, but only abnormal results are displayed) Labs Reviewed  RAPID STREP SCREEN ([facility]) - Abnormal; Notable for the following:       Result Value   Streptococcus, Group A Screen (Direct) POSITIVE (*)    All other components within normal limits    EKG  EKG Interpretation None       Radiology No results found.  Procedures Procedures (including critical care time)  Medications Ordered in ED Medications  penicillin g benzathine (BICILLIN LA) 1200000 UNIT/2ML injection 1.2 Million Units (1.2 Million Units Intramuscular Given 04/08/16 1632)     Initial Impression / Assessment and Plan / ED Course  I have reviewed the triage vital signs and the nursing notes.  Pertinent labs & imaging results that were available during my care of the patient were reviewed by me and considered in my medical decision making (see chart for details).  26 year old female here with recurrent strep throat. Diagnosis same last month and was treated that began feeling sick again 2 days ago. She is afebrile and nontoxic in appearance here. She is attending her secretions well. No difficult swallowing. Normal phonation without stridor.  Rapid strep test is positive. Will treat with Bicillin. Work note given. She was also given follow-up with ENT as this seems to be a recurrent issue.  Discussed plan with patient, she acknowledged understanding and agreed with plan of care.  Return precautions given for new or worsening symptoms.  Final Clinical Impressions(s) / ED Diagnoses   Final diagnoses:  Strep pharyngitis    New Prescriptions New Prescriptions   No medications on file     Garlon HatchetLisa M Koi Zangara, PA-C 04/08/16 1710    Courteney Lyn Corlis LeakMackuen, MD 04/09/16 2228

## 2016-04-08 NOTE — Discharge Instructions (Signed)
You have been treated for strep throat. Follow-up with ENT If you continue having issues. Return to the ED for new or worsening symptoms.

## 2016-06-27 IMAGING — CR DG CHEST 2V
2 series · 2 of 2 positions shown · non-contrast
Comparison: 09/08/2013

CLINICAL DATA: Shortness of breath and fever for 2 weeks.

EXAM:
CHEST  2 VIEW

[w chest pa]
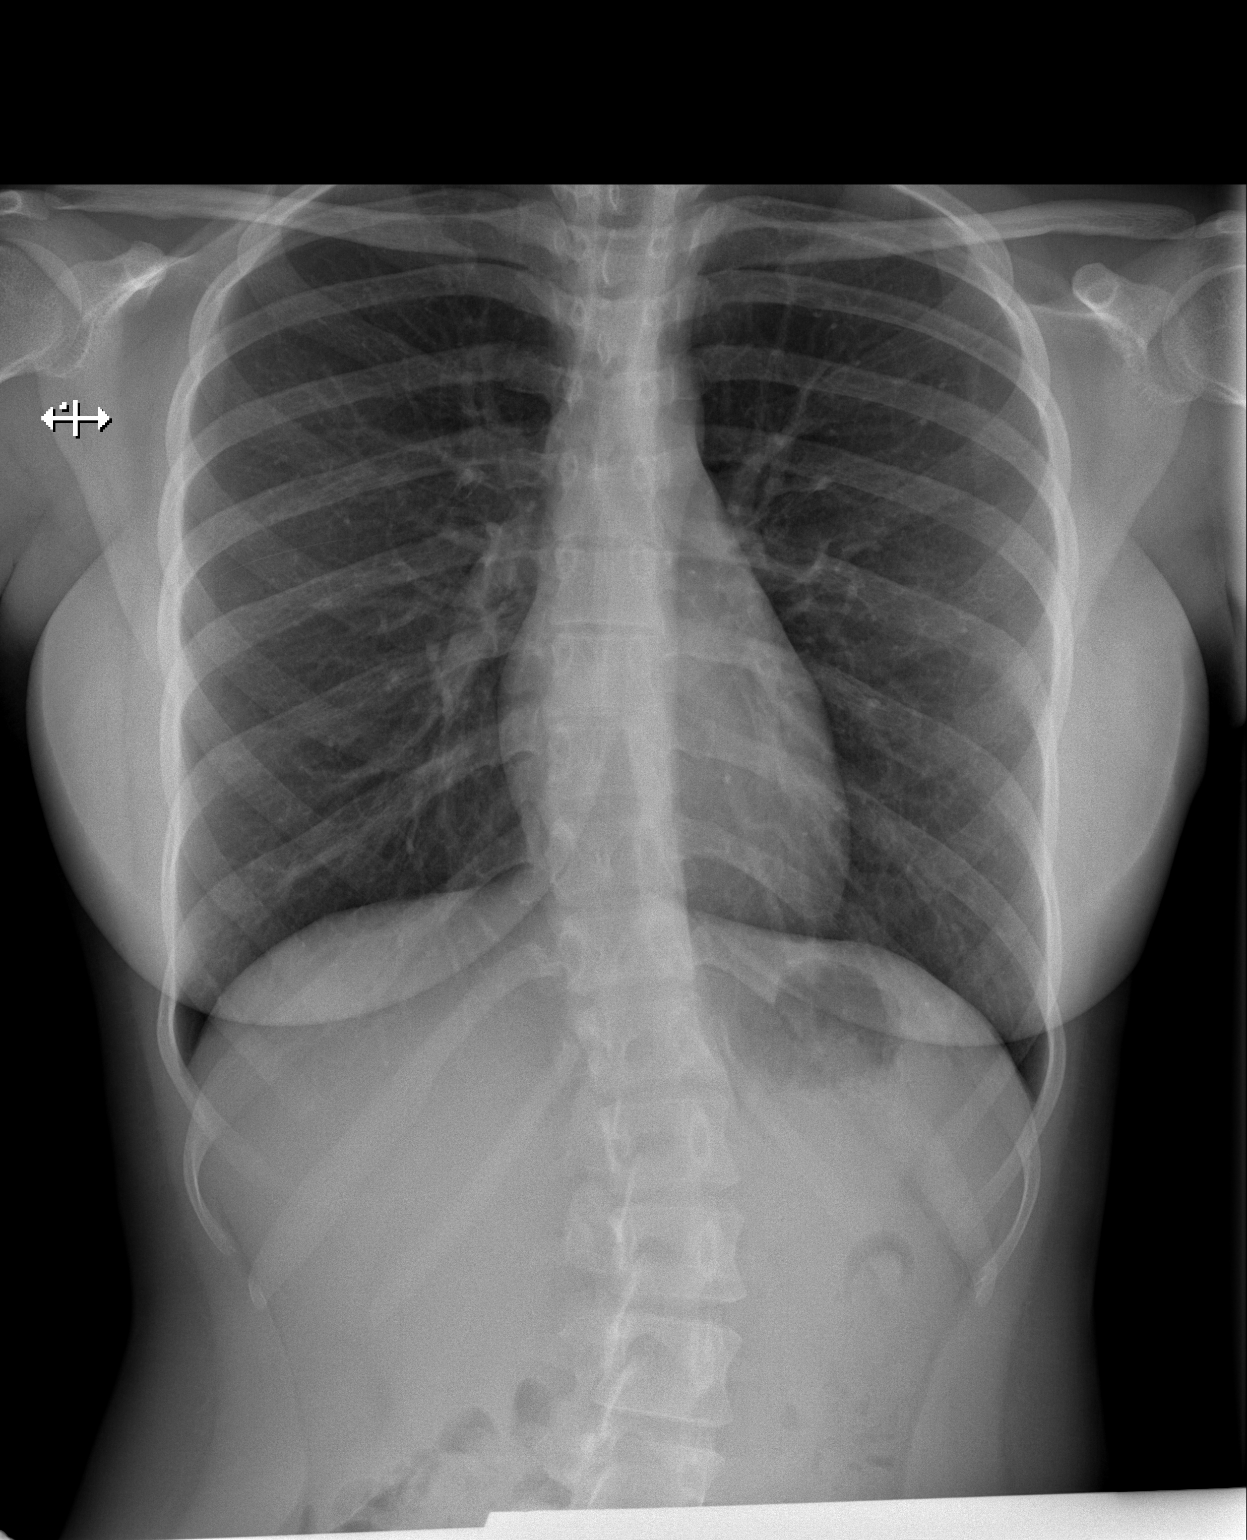

[w chest lat]
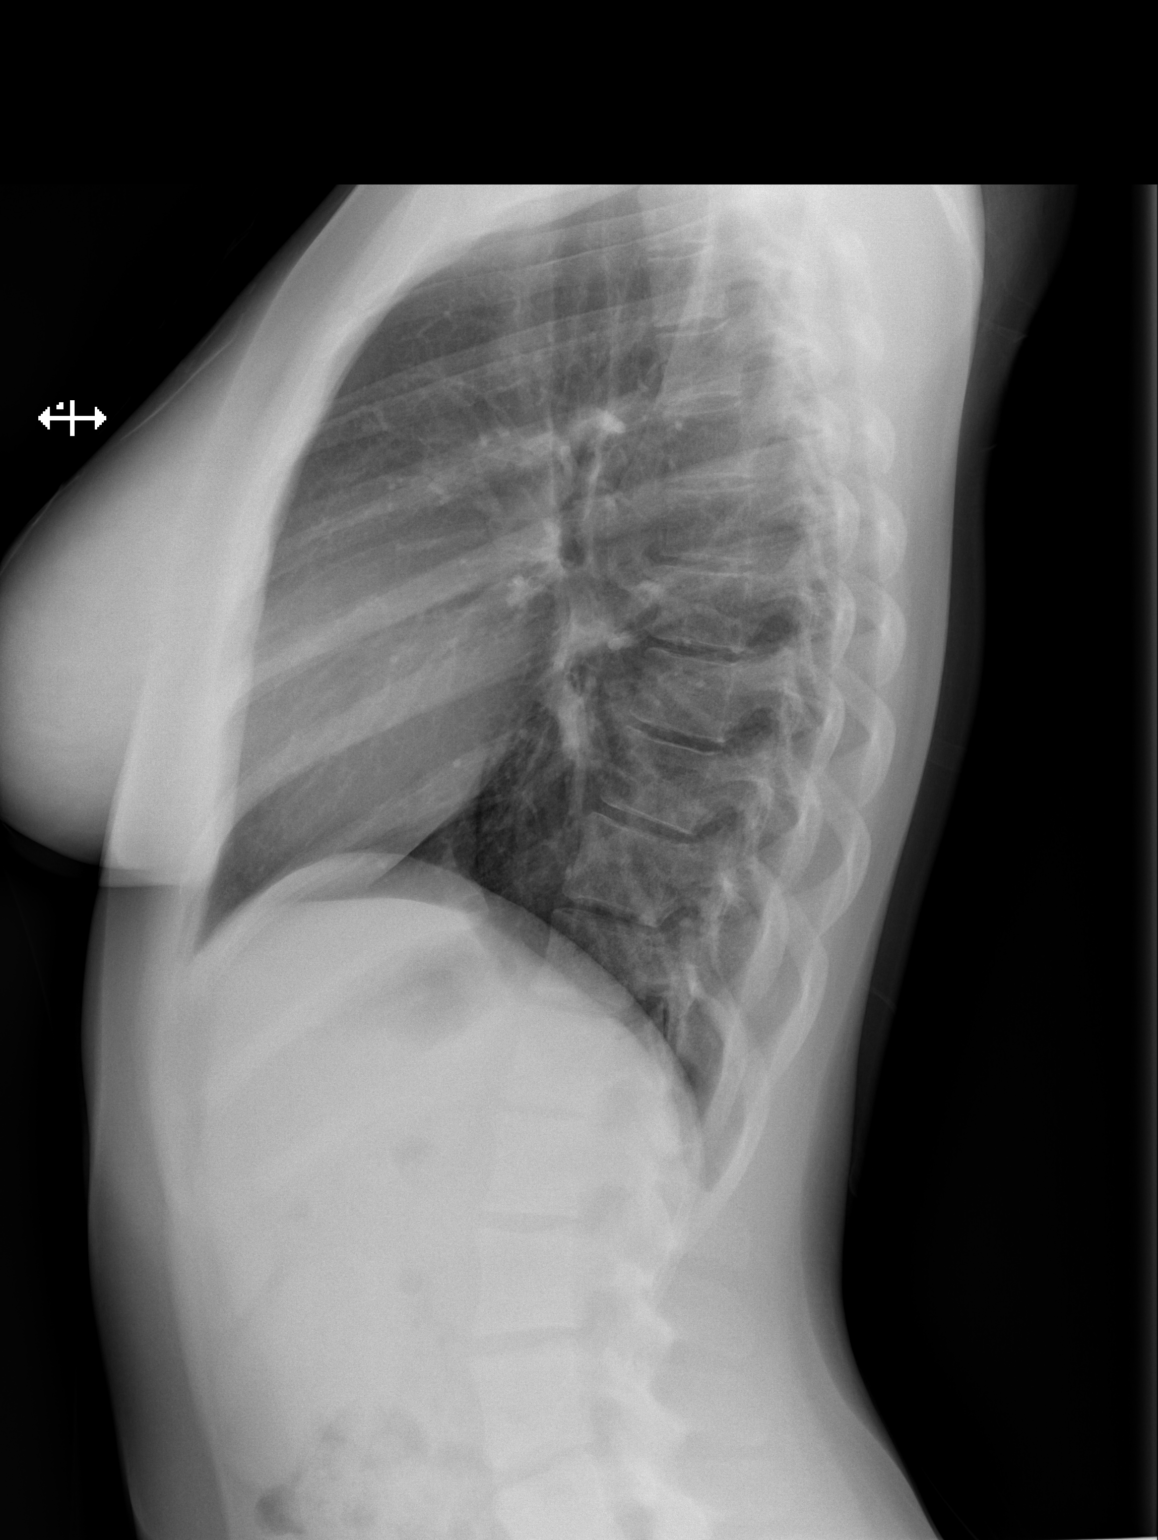

[2 of 2 positions shown; findings below may reference images not displayed]

FINDINGS: The cardiomediastinal silhouette is unremarkable.

There is no evidence of focal airspace disease, pulmonary edema,
suspicious pulmonary nodule/mass, pleural effusion, or pneumothorax.
No acute bony abnormalities are identified.

A thoracolumbar scoliosis is again identified.
IMPRESSION: No active cardiopulmonary disease.

## 2020-08-13 LAB — EGFR (EXT)
eGFR - Creat MDRD (EXT): >90 mL/min/{1.73_m2} (ref >=60.0)
eGFR - Creat MDRD (EXT): >90 mL/min/{1.73_m2} (ref >=60.0)

## 2020-09-14 ENCOUNTER — Other Ambulatory Visit

## 2020-09-14 ENCOUNTER — Encounter

## 2020-09-14 ENCOUNTER — Encounter (HOSPITAL_BASED_OUTPATIENT_CLINIC_OR_DEPARTMENT_OTHER)

## 2020-09-14 DIAGNOSIS — F32A Depression, unspecified: Principal | ICD-10-CM

## 2020-09-14 LAB — CBC
Hematocrit: 36.6 % (ref 32.0–47.0)
Hemoglobin: 12.3 g/dL (ref 11.0–16.0)
MCH: 27.5 pg (ref 26.0–34.0)
MCHC: 33.6 g/dL (ref 31.0–37.0)
MCV: 81.9 fL (ref 80.0–100.0)
MPV: 9.2 fL (ref 9.1–12.4)
NRBC %: 0 % (ref 0.0–0.0)
NRBC Absolute: 0 10*3/uL (ref 0.00–2.00)
Platelets: 395 10*3/uL (ref 150–400)
RBC: 4.47 M/uL (ref 3.70–5.20)
RDW-CV: 14.6 % — ABNORMAL HIGH (ref 11.5–14.5)
RDW-SD: 43.8 fL (ref 35.0–51.0)
WBC: 11.4 10*3/uL — ABNORMAL HIGH (ref 4.0–11.0)

## 2020-09-14 LAB — URINE DRUG SCREEN
Amphetamines screen, urine: NOT DETECTED
Barbiturates screen, urine: NOT DETECTED
Benzodiazepines screen, urine: NOT DETECTED
Buprenorphine screen, urine: NOT DETECTED
Cannabinoids screen, urine: DETECTED — AB
Cocaine screen, urine: NOT DETECTED
Creatinine, urine, specimen validity: 138.79 mg/dL (ref >=20.00)
Ethanol screen, urine: NOT DETECTED
Fentanyl screen, urine: NOT DETECTED
Methadone screen, urine: NOT DETECTED
Opiates screen, urine: NOT DETECTED
Oxycodone screen, urine: NOT DETECTED

## 2020-09-14 LAB — BASIC METABOLIC PANEL
Anion Gap: 10 mmol/L (ref 3–14)
BUN: 7 mg/dL (ref 6–24)
CO2 (Bicarbonate): 23 mmol/L (ref 20–32)
Calcium: 9.3 mg/dL (ref 8.5–10.5)
Chloride: 104 mmol/L (ref 98–110)
Creatinine: 0.71 mg/dL (ref 0.55–1.30)
Glucose: 83 mg/dL (ref 70–139)
Potassium: 3.6 mmol/L (ref 3.6–5.2)
Sodium: 137 mmol/L (ref 135–146)
eGFRcr: 119 mL/min/{1.73_m2} (ref >=60)

## 2020-09-14 LAB — SDS, SERUM DRUG SCREEN
Acetaminophen level: <3 ug/mL (ref <=30)
Barbiturates screen, serum: NOT DETECTED
Benzodiazepines screen, serum: NOT DETECTED
Ethanol level, plasma/serum: <10 mg/dL (ref <=10)
Salicylate level: <5 mg/dL (ref 0.0–30.0)
Tricyclics screen, serum: NOT DETECTED mg/dL

## 2020-09-14 LAB — HCG, QUANTITATIVE, PREGNANCY: hCG, serum, quantitative: <5 m[IU]/mL (ref <=5)

## 2020-09-14 NOTE — ED Provider Notes (Signed)
 EMERGENCY DEPARTMENT ENCOUNTER    ATTENDING NOTE    Date of service: 09/14/2020  9:40 PM    No chief complaint on file.      Nursing notes reviewed and agreed with unless modified below.    HISTORY OF PRESENT ILLNESS:    Becky Ho is a 30 y.o. female who  has a past medical history of Glaucoma, Mitral valve prolapse, and Mitral valve regurgitation. who presents with suicidal ideation.  Patient states that she had a plan to check into a hotel and cut her wrists with a razor blade.  However on the way out the door she noticed that her cat needed water and weather change and felt guilty calling suicide hotline and her sister who encouraged her to come to the emergency room.  Here on arrival she continues into endorse suicidal ideation.  She notes that she has had a longstanding history of depression or thoughts of the past couple years.    REVIEW OF SYSTEMS    Constitutional: no fevers  Eyes: no acute vision change   ENT: no rhinorrhea  Pulmonary: no cough  Cardiovascular: no chest pain  GI: no abd pain  GU: no dysuria  Neurologic: no new weakness/numbness  Musculoskeletal: no traumatic injury  Skin: no rash    PAST MEDICAL HISTORY / PROBLEM LIST    Past Medical History:   Diagnosis Date   ? Glaucoma    ? Mitral valve prolapse    ? Mitral valve regurgitation        There is no problem list on file for this patient.      PAST SURGICAL HISTORY    No past surgical history on file.    MEDICATIONS     Previous Medications    LATANOPROST OPHT    Administer into affected eye(s).        ALLERGIES    Allergies   Allergen Reactions   ? Corn Angioedema       SOCIAL HISTORY    Social History     Tobacco Use   ? Smoking status: Not on file   ? Smokeless tobacco: Not on file   Substance Use Topics   ? Alcohol use: Not on file     Social History     Substance and Sexual Activity   Drug Use Not on file       FAMILY HISTORY    No family history on file.    PHYSICAL EXAM    Vitals:    09/14/20 2141   BP: (!) 135/99   BP Location:  Left arm   Patient Position: Sitting   Pulse: 85   Resp: 18   Temp: 36.7 ?C (98.1 ?F)   TempSrc: Oral   SpO2: 98%   Weight: 59 kg   Height: 1.6 m        Physical Exam:    This is a pleasant young woman in no apparent distress.  Normocephalic atraumatic.  Anicteric sclera.  Oropharynx moist mucous membranes.  Voice is normal.  Cardiac exam is regular rate and rhythm.  Lungs are clear to auscultation.  Abdomen is soft and nontender.  Skin no rashes.  Moves all 4 extremities.  Mood is depressed.  Patient is very direct with her answers        LABS    Labs Reviewed   CBC - Abnormal       Result Value    WBC 11.4 (*)     RBC 4.47  Hemoglobin 12.3      Hematocrit 36.6      MCV 81.9      MCH 27.5      MCHC 33.6      RDW-SD 43.8      RDW-CV 14.6 (*)     Platelets 395      MPV 9.2      NRBC % 0.0      NRBC Absolute 0.00     URINE DRUG SCREEN - Abnormal    Amphetamines screen, urine Not Detected      Barbiturates screen, urine Not Detected      Benzodiazepines screen, urine Not Detected      Buprenorphine screen, urine Not Detected      Cannabinoids screen, urine Detected (*)     Cocaine screen, urine Not Detected      Ethanol screen, urine Not Detected      Fentanyl screen, urine Not Detected      Methadone screen, urine Not Detected      Opiates screen, urine Not Detected      Oxycodone screen, urine Not Detected      Creatinine, urine, specimen validity 138.79      Narrative:     These are unconfirmed screening results and should be used only for medical purposes. If the clinical setting requires confirmation, contact the clinical laboratory.     SDS, SERUM DRUG SCREEN - Normal    Acetaminophen level <3      Ethanol, plasma/serum <10      Salicylate level <5.0      Barbiturates screen, serum Not Detected      Benzodiazepines screen, serum Not Detected      Tricyclics screen Not Detected     HCG, QUANTITATIVE, PREGNANCY - Normal    hCG, serum, quantitative <5      Narrative:     Expected Results:  Adult Female: 0-1  mIU/mL  Non-pregnant Female: 0-4 mIU/mL    If pregnancy is expected, suggest repeat in 24-72 hours.    The concentration of HCG rises rapidly during early pregnancy to a level of 5,000 to 200,000 mIU/mL at 10-12 weeks, followed by a slow decline to levels of 1,000 to 50,000 mIU/mL during the 3rd trimester.    The use of this assay to monitor or diagnose patients with cancer or any other condiion unrelated to pregnancy has not been cleared or approved by the FDA or the manufacturer of the assay.   COVID-19 PCR TESTING    Narrative:     The following orders were created for panel order COVID-19 PCR Testing.  Procedure                               Abnormality         Status                     ---------                               -----------         ------                     Rapid SARS-CoV-2[[id number]]  Please view results for these tests on the individual orders.   BASIC METABOLIC PANEL    Sodium 137      Potassium 3.6      Chloride 104      CO2 (Bicarbonate) 23      Anion Gap 10      BUN 7      Creatinine 0.71      eGFRcr 119      Glucose 83      Fasting? Unknown      Calcium 9.3     RAPID SARS-COV-2        RADIOLOGY    X-rays contemporaneously visualized and interpreted by me     No orders to display       MEDICAL DECISION MAKING & ED COURSE    Pertinent labs and imaging studies reviewed.    ED Course as of 09/15/20 0153   Sat Sep 15, 2020   0013 Patient seen by psychiatry. She cannot leave AMA. Plan for in patient psychiatric admission.  [SO]   0033 Seen by psychiatry and recommended inpatient psychiatric treatment.  Likely bed availability in the morning on [address]. [SS]      ED Course User Index  [SO] Eulah Citizen, PA  [SS] Milas Hock, MD           CLINICAL IMPRESSION:    Suicidal ideation       Milas Hock, MD  09/15/20 [phone]

## 2020-09-14 NOTE — ED Progress Note (Signed)
 ED Course as of 09/15/20 0960   Sat Sep 15, 2020   0013 Patient seen by psychiatry. She cannot leave AMA. Plan for in patient psychiatric admission.  [SO]   0033 Seen by psychiatry and recommended inpatient psychiatric treatment.  Likely bed availability in the morning on [address]. [SS]   0204 Care assumed from Dr. Octaviano Glow, pending Psychiatric bed placement. [IH]   [id number] Care transferred to Dr. Mervyn Skeeters, pending Psychiatric bed placement. [IH]      ED Course User Index  [IH] [facility]  [SO] Eulah Citizen, [place]  [SS] Milas Hock, MD       Labs/Imaging  Labs Reviewed   CBC - Abnormal       Result Value    WBC 11.4 (*)     RBC 4.47      Hemoglobin 12.3      Hematocrit 36.6      MCV 81.9      MCH 27.5      MCHC 33.6      RDW-SD 43.8      RDW-CV 14.6 (*)     Platelets 395      MPV 9.2      NRBC % 0.0      NRBC Absolute 0.00     URINE DRUG SCREEN - Abnormal    Amphetamines screen, urine Not Detected      Barbiturates screen, urine Not Detected      Benzodiazepines screen, urine Not Detected      Buprenorphine screen, urine Not Detected      Cannabinoids screen, urine Detected (*)     Cocaine screen, urine Not Detected      Ethanol screen, urine Not Detected      Fentanyl screen, urine Not Detected      Methadone screen, urine Not Detected      Opiates screen, urine Not Detected      Oxycodone screen, urine Not Detected      Creatinine, urine, specimen validity 138.79      Narrative:     These are unconfirmed screening results and should be used only for medical purposes. If the clinical setting requires confirmation, contact the clinical laboratory.     SDS, SERUM DRUG SCREEN - Normal    Acetaminophen level <3      Ethanol, plasma/serum <10      Salicylate level <5.0      Barbiturates screen, serum Not Detected      Benzodiazepines screen, serum Not Detected      Tricyclics screen Not Detected     HCG, QUANTITATIVE, PREGNANCY - Normal    hCG, serum, quantitative <5      Narrative:     Expected Results:  Adult Female: 0-1  mIU/mL  Non-pregnant Female: 0-4 mIU/mL    If pregnancy is expected, suggest repeat in 24-72 hours.    The concentration of HCG rises rapidly during early pregnancy to a level of 5,000 to 200,000 mIU/mL at 10-12 weeks, followed by a slow decline to levels of 1,000 to 50,000 mIU/mL during the 3rd trimester.    The use of this assay to monitor or diagnose patients with cancer or any other condiion unrelated to pregnancy has not been cleared or approved by the FDA or the manufacturer of the assay.   COVID-19 PCR TESTING    Narrative:     The following orders were created for panel order COVID-19 PCR Testing.  Procedure  Abnormality         Status                     ---------                               -----------         ------                     Rapid SARS-CoV-2[[id number]]                                                               Please view results for these tests on the individual orders.   BASIC METABOLIC PANEL    Sodium 137      Potassium 3.6      Chloride 104      CO2 (Bicarbonate) 23      Anion Gap 10      BUN 7      Creatinine 0.71      eGFRcr 119      Glucose 83      Fasting? Unknown      Calcium 9.3     RAPID SARS-COV-2      No orders to display       Assessment and Plan  Care signed out from Dr. Octaviano Glow, pending psychiatric bed placement. There were no untoward events overnight. His care is transferred to Dr. Mervyn Skeeters, pending continued Psychiatric bed search.     By signing my name below, I, Pilar Grammes, attest that this documentation has been prepared under the direction and presence of Dr. Vernona Rieger, DO.    Impression:  1. SI    Electronically signed: Pilar Grammes, Scribe. 09/15/20 6:52 AM.    Consent to use a scribe was obtained prior to the start of the encounter by clinical staff.     Charlyne Quale, MD  09/15/20 [phone]

## 2020-09-14 NOTE — ED Notes (Addendum)
 Pt presents endorsing SI w/ plan to go to hotel and cut wrists w/ exacto knife. States she has had increased stressors recently, last week was her dad's birthday who is deceased and recent breakup w/ her gf, leading to worsening depression. Denies HI, denies A/V hallucinations. Calm and cooperative at this time but softspoken. Placed in view of 1:1 sitter.              Jackquline Berlin, RN  09/14/20 2242

## 2020-09-14 NOTE — ED Provider Notes (Signed)
 Name Becky Ho  DOB June 26, 1990  Age 30 y.o.  CC No chief complaint on file.      CHIEF COMPLAINT: SI    HISTORY OF PRESENT ILLNESS:  Becky Ho is a 30 y.o. female with hx of depression who presents to the emergency department with SI with plan. She reports feeling overwhelmed due to some life stressors recently as well as recent break up with partner. Today she planned to get a hotel room where she would slit her wrists in bathtub. She specifically planned to get hotel room so that her roommate would not have to find her. On the way out she noticed that she needed to giver her cat water/change liter box. While doing this she decided to stop and call suicide hotline and her sister who prompted her to come to the ED. She has had suicidal thoughts in the past. She is in out patient therapy. She is not on any psychiatric medications and specifically stopped all sleep medications in 2016 because she had thoughts over overdosing on them several times. No drugs or alcohol. She has no specific medical concerns at this time,     PAST MEDICAL, FAMILY AND SOCIAL HISTORY:  Past Medical History:   Diagnosis Date   ? Glaucoma    ? Mitral valve prolapse    ? Mitral valve regurgitation     No past surgical history on file.     Social History     Tobacco Use   ? Smoking status: Not on file   ? Smokeless tobacco: Not on file   Substance Use Topics   ? Alcohol use: Not on file      Social History     Substance and Sexual Activity   Drug Use Not on file        No family history on file.     Previous Medications    LATANOPROST OPHT    Administer into affected eye(s).         REVIEW OF SYSTEMS  Review of Systems - Constitutional: Negative for fevers or recent illness. Eyes: Negative for blurry vision, pain, redness, swelling. HEENT: Negative for rhinorrhea, sore throat. Neck: Negative for pain. Pulmonary: Negative for cough, shortness of breath, wheezing. Cardiovascular: Negative for chest pain, edema, palpitations. Abdominal: Negative  for abdominal pain, nausea/vomiting/diarrhea. GU: Negative for urinary symptoms. Neuro: Negative for headache. Musculoskeletal: Negative for pain or injury. Skin: Negative for rash or lesions. Psych: Positive for SI and depression. Negative for drug or alcohol abuse.      PHYSICAL EXAM:   Vital Signs:   ED Triage Vitals [09/14/20 2141]   Temp Pulse Resp BP   36.7 ?C (98.1 ?F) 85 18 (!) 135/99      SpO2 Temp Source Heart Rate Source Patient Position   98 % Oral Monitor Sitting      BP Location FiO2 (%)     Left arm --       Alert, well nourished, well developed, appears comfortable.  Head: NC/AT.  Eyes: Conjunctivae pink, no jaundice.  ENT: Moist mucosa, pink and intact.   Neck: Supple.  Pulmonary: Clear to auscultation bilaterally, no wheezes, rales or rhonchi  Cardiac: RRR.  Abdomen: Soft, non-tender, non-distended, no masses, no peritoneal signs.  Skin:  Moist, pink, no rash, no swelling, brisk capillary refill.  Psych: Behavior is pleasant and cooperative. Tearful. Poor eye contact. Insightful.  Neuro: A&Ox3, moves all extremities, normal gait.      LABORATORY STUDIES:  Labs Reviewed  CBC - Abnormal       Result Value    WBC 11.4 (*)     RBC 4.47      Hemoglobin 12.3      Hematocrit 36.6      MCV 81.9      MCH 27.5      MCHC 33.6      RDW-SD 43.8      RDW-CV 14.6 (*)     Platelets 395      MPV 9.2      NRBC % 0.0      NRBC Absolute 0.00     URINE DRUG SCREEN - Abnormal    Amphetamines screen, urine Not Detected      Barbiturates screen, urine Not Detected      Benzodiazepines screen, urine Not Detected      Buprenorphine screen, urine Not Detected      Cannabinoids screen, urine Detected (*)     Cocaine screen, urine Not Detected      Ethanol screen, urine Not Detected      Fentanyl screen, urine Not Detected      Methadone screen, urine Not Detected      Opiates screen, urine Not Detected      Oxycodone screen, urine Not Detected      Creatinine, urine, specimen validity 138.79      Narrative:     These are  unconfirmed screening results and should be used only for medical purposes. If the clinical setting requires confirmation, contact the clinical laboratory.     SDS, SERUM DRUG SCREEN - Normal    Acetaminophen level <3      Ethanol, plasma/serum <10      Salicylate level <5.0      Barbiturates screen, serum Not Detected      Benzodiazepines screen, serum Not Detected      Tricyclics screen Not Detected     HCG, QUANTITATIVE, PREGNANCY - Normal    hCG, serum, quantitative <5      Narrative:     Expected Results:  Adult Female: 0-1 mIU/mL  Non-pregnant Female: 0-4 mIU/mL    If pregnancy is expected, suggest repeat in 24-72 hours.    The concentration of HCG rises rapidly during early pregnancy to a level of 5,000 to 200,000 mIU/mL at 10-12 weeks, followed by a slow decline to levels of 1,000 to 50,000 mIU/mL during the 3rd trimester.    The use of this assay to monitor or diagnose patients with cancer or any other condiion unrelated to pregnancy has not been cleared or approved by the FDA or the manufacturer of the assay.   RAPID SARS-COV-2 - Normal    SARS-CoV-2 RNA PCR Negative     COVID-19 PCR TESTING    Narrative:     The following orders were created for panel order COVID-19 PCR Testing.  Procedure                               Abnormality         Status                     ---------                               -----------         ------  Rapid SARS-CoV-2[[id number]]              Normal              Final result                 Please view results for these tests on the individual orders.   BASIC METABOLIC PANEL    Sodium 137      Potassium 3.6      Chloride 104      CO2 (Bicarbonate) 23      Anion Gap 10      BUN 7      Creatinine 0.71      eGFRcr 119      Glucose 83      Fasting? Unknown      Calcium 9.3          IMAGING STUDIES:  No orders to display        PROCEDURES:  Procedures    ED MEDICATIONS:  ED Medication Administration from 09/14/2020 2133 to 09/15/2020 1316       Date/Time Order Dose  Route Action Action by     09/15/2020 1040 escitalopram (Lexapro) tablet 5 mg 5 mg oral Given Hagstrom, K           ED COURSE & MDM  ED Course as of 09/15/20 1316   Sat Sep 15, 2020   0013 Patient seen by psychiatry. She cannot leave AMA. Plan for in patient psychiatric admission.  [SO]   0033 Seen by psychiatry and recommended inpatient psychiatric treatment.  Likely bed availability in the morning on [facility]. [SS]   0204 Care assumed from Dr. Octaviano Glow, pending Psychiatric bed placement. [IH]   [id number] Care transferred to Dr. Mervyn Skeeters, pending Psychiatric bed placement. [IH]      ED Course User Index  [IH] [facility]  [SO] Eulah Citizen, [place]  [SS] Milas Hock, MD         Diagnoses as of 09/15/20 1316   Suicidal ideation     30 y/o female presents to the ED with SI and specific plan. Psychiatry consulted for evaluation. They saw the patient in the ED who recommended in patient level of care. Possible [facility] admission tomorrow. The patient was signed out at change of shift with ongoing in patient bed search.    1. Suicidal ideation          Nursing notes and prior medical records reviewed. Reviewed         Eulah Citizen, PA  09/15/20 1316
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
## 2020-09-14 NOTE — ED Notes (Signed)
 Assumed care on stretcher in NAD, talking, pt remains on 1:1 observation     Mat Carne, RN  09/14/20 2337

## 2020-09-14 NOTE — ED Provider Notes (Signed)
 I assumed care of this patient at signout after arrival for my current shift.     Briefly, Becky Ho is a 30 y.o. female who presented with suicidal ideation.     For full HPI: please see note by attending physician handing off.     ED Course as of 09/15/20 1520   Sat Sep 15, 2020   0013 Patient seen by psychiatry. She cannot leave AMA. Plan for in patient psychiatric admission.  [SO]   0033 Seen by psychiatry and recommended inpatient psychiatric treatment.  Likely bed availability in the morning on [facility]. [SS]   0204 Care assumed from Dr. Octaviano Glow, pending Psychiatric bed placement. [IH]   [id number] Care transferred to Dr. Mervyn Skeeters, pending Psychiatric bed placement. [IH]      ED Course User Index  [IH] [facility]  [SO] Eulah Citizen, [place]  [SS] Milas Hock, MD         Diagnoses as of 09/15/20 1520   Suicidal ideation       I have visualized, reviewed, and interpreted the patient's pertinent labs, imaging studies and results:   + cannabis on urine. WBC 11.4.     WORKING CLINICAL DIAGNOSIS: SI     Plan: Admitted to [facility] to      Darene Lamer, MD  09/15/20 1521

## 2020-09-14 NOTE — ED Triage Notes (Signed)
 Pt reports SI with plan to get a hotel room, and cut her wrists in a bathtub. States  She has been feeling like this "for awhile" denies Si attempts in the past. Denies drugs/alcohol

## 2020-09-15 ENCOUNTER — Encounter
Admission: EM | Admit: 2020-09-15 | Discharge: 2020-09-21 | Disposition: A | Discharge status: Home / Self Care | Payer: BLUE CROSS/BLUE SHIELD | Admitting: Psychiatry

## 2020-09-15 ENCOUNTER — Encounter (HOSPITAL_BASED_OUTPATIENT_CLINIC_OR_DEPARTMENT_OTHER): Admitting: Psychiatry

## 2020-09-15 LAB — RAPID SARS-COV-2: SARS-CoV-2 RNA PCR: NEGATIVE

## 2020-09-15 MED ORDER — escitalopram (Lexapro) tablet 5 mg
5 | Freq: Every day | ORAL | Status: DC
Start: 2020-09-15 — End: 2020-09-18
  Administered 2020-09-15 – 2020-09-18 (×4): 5 mg via ORAL

## 2020-09-15 MED ORDER — ibuprofen tablet 400 mg
400 | ORAL | Status: DC | PRN
Start: 2020-09-15 — End: 2020-09-21

## 2020-09-15 MED ORDER — latanoprost (Xalatan) 0.005 % ophthalmic solution 1 drop
0.005 | Freq: Every evening | OPHTHALMIC | Status: DC
Start: 2020-09-15 — End: 2020-09-21
  Administered 2020-09-16 – 2020-09-21 (×6): 1 [drp] via OPHTHALMIC

## 2020-09-15 MED ORDER — OLANZapine zydis (ZyPREXA) disintegrating tablet 5 mg
5 | Freq: Four times a day (QID) | ORAL | Status: DC | PRN
Start: 2020-09-15 — End: 2020-09-21

## 2020-09-15 MED ORDER — LORazepam (Ativan) tablet 1 mg
1 | Freq: Four times a day (QID) | ORAL | Status: DC | PRN
Start: 2020-09-15 — End: 2020-09-21

## 2020-09-15 MED ORDER — acetaminophen (Tylenol) tablet 650 mg
325 | Freq: Four times a day (QID) | ORAL | Status: DC | PRN
Start: 2020-09-15 — End: 2020-09-21

## 2020-09-15 MED FILL — ESCITALOPRAM 5 MG TABLET: 5 5 mg | ORAL | Qty: 1

## 2020-09-15 MED FILL — LATANOPROST 0.005 % EYE DROPS: 0.005 0.005 % | OPHTHALMIC | Qty: 2.5

## 2020-09-15 NOTE — Nursing Note (Signed)
 7pm-11pm: Patient was taken over laying in bed. Alert and oriented to person, place, time, and situation. Calm, cooperative, sad expression. Denies SI at this time. Denies any concerns that she wishes to address at this time.

## 2020-09-15 NOTE — ED Notes (Signed)
 Pt sleeping, 1:1 obsrevation     Mat Carne, RN  09/15/20 [phone]

## 2020-09-15 NOTE — Progress Notes (Signed)
 SW received page from Psychiatry - Pt is level for [facility] and accepted to [facility]. Psychiatry requesting insurance authorization. SW conducted chart review - Pt has [employer] and does not require pre-auth. [employer] also requires auth M-F - no weekend coverage.    SW informed psychiatry.     Cope-Flanagan, LICSW p.[phone]

## 2020-09-15 NOTE — Progress Notes (Signed)
 Emergency Psychiatry Service    Patient Name: Becky Ho  Date of Birth: 03/01/1991  Medical Record #: [id number]  Date of Admission: 09/14/2020  Date of this Evaluation: 09/15/20    CC: "I'm tired of being this way"    Interval History     No events overnight. Saw patient in the emergency room in the morning, sleeping peacefully. She reports 10 years of depression with suicidal thoughts, wanting to die to be with her father, who passed away in 23 when she was 30 years old. Previous meds trialed: Wellbutrin for 1 year, helped her get out of bed and do things. No prior suicide attempts, previous suicidal plans were usually to overdose on pills. Reports that yesterday was the first time she thought she might actually act on her suicidal thoughts. She states that she planned to cut her wrists because she didn't have access to pills. Recent stressors include breaking up with her girlfriend who was a major support, and that last week was the anniversary of her father's birthday. Waited for her roommate to be out of town. Had plan to get a hotel room and cut her wrists in the bathtub with an exactoknife. Stopped because she saw her cat and felt guilt. Called a suicide hotline and her sister encouraged her to go to the ED.     This morning, continues to report SI with plan, states that she wouldn't feel safe taking any medications she could overdose on, and is afraid to leave the hospital. Wants to get better. She also reports feeling very tired, and endorses several months of difficulty falling asleep. Used to take sleeping pills, but reports history of abusing them (would take Zolpidem along with excess alcohol use). She also had suicidal thoughts with plans to save up pills for an intentional overdose. She stopped Zolpidem due to these concerns.     Also endorses chronic anxiety, with panic attacks (sweating, heart racing, thoughts racing) at least once per week, usually around meetings for work. Notes that at these  panic attacks trigger suicidal thoughts, wanting to escape feeling that way. Also notes that she often thinks she needs to "plan ahead" with planning suicide attempt so that she doesn't have to experience the distressing panic attacks. Denies manic episodes. Sober, last drank alcohol 1 year ago. Supports are her mother and sister, however neither are in the [place] area.    Additional Medical/Psychiatric/SUD/Social/Family History:     Please see previous psychiatry note from Gennaro Africa, MD.     Reports chronic glaucoma (diagnosed at age 2, 8 years ago), with nightly latanoprost drops bilaterally. Also reports history of mitral valve prolapse and mitral regurgitation, followed by cardiology, no medications.    Medications:     Standing:  escitalopram, 5 mg, oral, Daily  latanoprost, 1 drop, Both Eyes, Nightly      PRN:         ROS     A focused ROS was performed and was negative unless otherwise stated in the HPI    Vitals     Visit Vitals  BP (!) 135/99 (BP Location: Left arm, Patient Position: Sitting)   Pulse 85   Temp 36.7 ?C (98.1 ?F) (Oral)   Resp 18       Exam     Physical Exam: Please see note from primary team from 09/15/20    Mental Status Evaluation:    Appearance: lying in bed, sleeping; once woken, awake, alert, NAD, good grooming and hygiene, wearing  eyeglasses, gown  Behavior: Pleasant, cooperative  Eye Contact: fair, occasionally looking down  Speech: Normal in rate, volume, and tone  Mood: "pretty bad"  Affect: dysphoric, tearful, mood congruent, somewhat restricted   Thought Process: Logical, linear  Thought Content: Devoid of obviously delusional content  SI/HI: SI with plan to cut her wrist in the bathtub or overdose on pills  Sensorium/Perceptions: does not appear to be RTIS  Insight/Judgement: fair insight, fair judgment     Cognitive Exam:     Orientation/Sensorium: intact to observation, not formally assessed  Fund of Knowledge: Average  Language: Normal    EKG     No results found  for this or any previous visit (from the past 4464 hour(s)).    Labs     CBC, BMP wnl  UTox positive for cannabinoids  Blood tox negative    Imaging     Reviewed, none    Diagnoses     Unspecified depressive disorder    Impression     Becky Ho is a 30 y/o female with past history of depression, anxiety with panic attacks, SIB, and chronic suicidal ideation presenting to the ED with SI with plan to cut her wrists. Endorses chronic thoughts of suicide, exacerbated by recent stressors of breaking up with her girlfriend, and the recent anniversary of her father's birthday (who died when she was 6). Continues to endorse SI with plan, noting that she would not feel safe taking pills that she could overdose on. On exam, appears dysphoric, tearful at times, and does not feel safe leaving the hospital. Meets criteria for inpatient psychiatric level of care, bed search is ongoing. Had previous partial medication trial with Wellbutrin which was she felt helped with ability to get up and do things, but did not lessen depressive symptoms or suicidal thoughts. Not currently taking any psychiatric meds; will start escitalopram. Denies history of manic episodes. Also reports difficulty sleeping, but reluctant to take sleeping pills at the moment.     Safety Assessment:  Regarding safety, the patient denies thoughts of harm to self or others. The patient has risk factors that include previous self-harmactive SI, anxiety, impulsivity and active depression. Protective factors include help-seeking behavior. In considering these factors, the patient is at acute risk for intentional harm to self or others.    Recommendations/Plan     #Unspecified depressive disorder  - S12 in chart, cannot leave AMA  - recommend starting escitalopram 5mg  daily  - meets [facility], bed search ongoing  - Covid negative  - f/u EKG    #Glaucoma  - continue home latanoprost drops bilateral eyes, nightly    Please TigerText the undersigned resident or page  [phone] with questions or concerns about this case during business hours Monday-Friday (for time-sensitive weekend or overnight issues, you may TigerText or page [phone] for the on-call psychiatry resident).    Recommendations were communicated to Eulah Citizen PA and Milly Jakob PA from the primary team.    Patient was staffed and the plan was discussed with Dr. Ellyn Hack, Attending Psychiatrist     Signature  Name:  Denton Meek, MS3 at [facility]  Mechele Claude, MD, PGY-1 Psychiatry [phone] or TT

## 2020-09-15 NOTE — ED Notes (Signed)
 Pt sleeping. + chest rise and fall     April Manson, RN  09/15/20 [phone]

## 2020-09-15 NOTE — ED Notes (Signed)
 Pt resting and appears in no distress. Pt continues to have 1:1 sitter. Pt calm and cooperative. PT ordered PO meal.      Milinda Pointer, RN  09/15/20 1153

## 2020-09-15 NOTE — ED Progress Note (Signed)
 This patient was signed out to me from [place] O'Neill at 7 AM pending psychiatry reevaluation this morning and plan for disposition.  There were no acute events reported overnight.  Patient continues on section 12.  She has been reported to be resting comfortably.  Patient was admitted to the inpatient psychiatric unit at this time for further management.      Jarvis Morgan, [place]  09/23/20 1805

## 2020-09-15 NOTE — ED Notes (Signed)
 Psych evaluated pt     April Manson, RN  09/15/20 [phone]

## 2020-09-15 NOTE — H&P (Signed)
 Patient Name: Becky Ho  Date of Birth: 02/04/1991  Medical Record #: [id number]  Date of Admission: 09/14/2020  Date of this Evaluation: 09/15/20    Chief Complaint     "I'm tired"    HPI     Becky Ho is a 30 y.o. female, with a history of depression, anxiety with panic attacks, SIB, and chronic suicidal ideation who presents to [place] [facility] from [facility] ED (self-presented) with SI and plan to cut her wrists.     No behavioral events or prn's in the ED.    She describes a very specific plan to rent a hotel room so that her roommate wouldn't have to find her body, run a warm bath, and use an exactoknife to slit her wrists. Has an exactoknife at home from previous college art projects. She states that previously, her suicidal thoughts centered around overdosing on pills, since she thought that would be a less painful method, but that on the day of admission, she did not have access to pills. Before coming up with the plan to use the exactoknife, she Googled other methods. She reports that 3 days ago, her roommate confronted her about her private medical history and told her that they wouldn't be friends anymore, only roommates. She notes that this was a major stressor for her, since this was a friend she had had since high school, and her only support system in [place]. She also notes that her roommate found out about her medical history from their other best friend, and she felt betrayed and pushed over the edge. Other recent stressors include her late father's birthday last week, and feeling overwhelmed with work and life. The patient notes that she feels like she has been "putting on a face" and pretending to be alright.    The patient reports improved mood since coming to the ED. She states that she feels better after talking to her sister, is happy that her mother is coming from [place] to see her, and is feeling more hopeful now that she is taking steps to get back on anti-depressants.     Collateral Ruqayyah sister  [phone]  Sister reports that the patient recently got into a fight with her roommate, which she thinks triggered the patient's increased suicidal thoughts. States that the patient moved to [place] 1 year ago, broke up with her girlfriend in February, and just moved in to the new apartment with her roommate 1 week ago. Provided information for the patient's PCP: Dr Anna Genre in [place] [phone]).    Please see Dr. Revonda Standard Aviana Shevlin's note from 09/15/2020 for additional history.    Additional History     Psychiatric History:  History of self-injurious behavior by pinching her skin and digging her nails in. Reports generalized anxiety and panic attacks since childhood. History of disordered eating, would restrict and pass out frequently in high school in college. 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    Previously had a psychiatrist in [place], Oswaldo Done [place]ambia ([id number]). At that time, was prescribed Wellbutrin XL 500mg  qAM and Ambien 5mg  qHS for major depressive disorder. Has also tried herbals and St John's Wort.    Current Treaters:   PCP Dr Anna Genre [phone]), based in [place].  Therapist Delaney Meigs at [facility] in [place].  Does not currently have a psychiatrist. Was working with her therapist to find one.    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 Sees cardiologist annually, no meds.  Glaucoma. Takes latanoprost 0.05% drops bilaterally nightly. Had surgery.    Family History:   2 cousins on her mother's side with bipolar disorder. Her maternal grandfather had a "mental breakdown." Both her maternal and paternal grandfather struggled with addiction.    Social History:   Lives in [place] with her roommate.  Graduated from college.  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  LGBTQ+, had recent breakup with girlfriend (February 2022).    Substance Use History:  In 2011 as a freshman in college, used many  substances to "numb the pain:" molly, acid, cocaine, codeine, K2, mushrooms, weed.   Currently, uses marijuana daily for eye pain in the form of edibles or tinctures. Last use was Monday, the "1906 pill" which she believes is about 5mg .  Previously drank alcohol regularly, but stopped after realizing it made her more depressed. Now only drinks on special occasions.    Review of Symptoms     A comprehensive ROS was performed and was negative in detail except as noted in HPI and the following pertinent positives: Nothing additional    Physical Exam on Admission     Vitals:   Visit Vitals  BP (!) 134/97 (BP Location: Left arm, Patient Position: Sitting)   Pulse 78   Temp 36.8 ?C (98.2 ?F) (Oral)   Resp 17     General: Well appearing female  HEENT: extra ocular movement intact  Heart: RRR, no murmurs, rubs, or gallops  Lungs: No increased work of breathing, CTA bilaterally  Abdomen: Nondistended, nontender, soft in all 4 quadrants   Neuro: Strength grossly intact. No tremors noted.     Mental Status Exam     Appearance: sitting up in bed, awake, alert, NAD, good grooming and hygiene, wearing eyeglasses  Behavior: Pleasant, cooperative  Speech: Normal in rate, volume, and tone  Mood: "ok"  Affect: dysphoric and congruent with mood, somewhat restricted  Thought Process: Logical, linear  Thought Content: Devoid of obviously delusional content  SI/HI: SI with plan to slit her wrists; denies HI  Perceptions: Not obviously responding to internal stimuli  Insight/Judgement: fair insight, fair judgment    Cognitive Exam:   Intact to observation, not formally assessed.     BPRS: 37    Labs, EKG, Imaging     Laboratory Studies: Reviewed  UTox positive for cannabinoids  Covid negative    EKG: No results found for this or any previous visit (from the past 4464 hour(s)).    Imaging: Reviewed, no results.    Impression     Becky Ho is a 30 y/o female with past history of depression, anxiety with panic attacks, SIB, and chronic  suicidal ideation presenting to the ED with SI with feasible plan to cut her wrists with an exactoknife, intent, and access to the method. Recent stressors of interpersonal conflict with roommate, late father's recent birthday. She is at increased risk of suicide given chronic suicidal ideation, impulsivity, and lack of local social supports. On arrival to the floor, she appears dysphoric and continues to endorse SI with plan. Denies symptoms or history consistent with manic episodes. Will continue with escitalopram 5mg  that was started in the ED, monitor for side effects and consider uptitrating on Monday. Reports difficulty sleeping, but reluctant to take sleeping pills at the moment.     For these reasons, we believe that the patient is a danger to themselves due to a psychiatric illness, and requires an  inpatient level of care for diagnostic evaluation, medication initiation, medication optimization, stabilization of their acute psychiatric symptoms and care planning.    Plan     #Unspecified depressive disorder  ? Continue escitalopram 5mg  daily  ? Consider benadryl or trazodone for insomnia  ? Diagnostic evaluation  ? Initially 12B in chart, then signed in CV  ? PRNs:  o Lorazepam 1mg  PO q6h prn severe anxiety/agitation  o Olanzapine 5mg  PO q8h prn severe agitation  ? Follow up labs and EKG  ? Consider obtaining collateral from:  o PCP Gene Catha Brow [phone])  o Therapist Delaney Meigs at [facility]  o Sister Ruqayyah sister [phone]  o Mother   ? Assess FAB daily    #Glaucoma  ? Continue home latanoprost drops, bilateral eyes nightly    #Bundle  COVID: Fully vaccinated, no booster  Legal: CV  FAB: Restrict to Unit  Diet: Regular (pescetarian)    Signed:  Mechele Claude, MD   PGY-1 Psychiatry

## 2020-09-15 NOTE — Progress Notes (Signed)
 CC: "I'm tired of being this way"  ?  Interval History   ?  30 yo w/ h/o of what seems a clear case of unipolar depression. PT reports 10 years of depression with suicidal thoughts, wanting to die to be with her father, who passed away in 23 when she was 30 years old. Reports one only medication trial of Wellbutrin (unknown dose) for 1 year, helped her get out of bed and do things, but didn't take suicidality away fully.  Despite history of suicidal thoughts, reports no prior suicide attempts; previous suicidal plans were usually to overdose on pills. Reports that yesterday was the first time she thought she might actually act on her suicidal thoughts. She states that she planned to cut her wrists because she didn't have access to pills.   Recent stressors include breaking up with her girlfriend who was a major support, and that last week was the anniversary of her father's birthday. Waited for her roommate to be out of town. Had plan to get a hotel room and cut her wrists in the bathtub with an exactoknife. Stopped because she saw her cat and felt guilt. Called a suicide hotline and her sister encouraged her to go to the ED. + appropriately tearful while reporting this to me and the team. ?  This morning, upon the niht spent in the ED< continues to report SI with same plan plan, states that she wouldn't feel safe taking any medications she could overdose on, and is afraid to leave the hospital. + Motivated to get better. + low E, fatigue, endorses several months of difficulty falling asleep. Used to take sleeping pills, but reports history of abusing Zolpidem along with excess alcohol  Reports no current alcohol use or abuse, and sobriety of over 1 year now.  Also reports having stopped Zolpidem due to these concerns. ?  Endorses chronic anxiety, with panic attacks (sweating, heart racing, thoughts racing) at least once per week, usually around meetings for work. Notes that at these panic attacks trigger suicidal  thoughts, wanting to escape feeling that way. Also notes that she often thinks she needs to "plan ahead" with planning suicide attempt so that she doesn't have to experience the distressing panic attacks. Denies manic episodes. Sober, last drank alcohol 1 year ago. Supports are her mother and sister, however neither are in the [place] area.  ?  Additional Medical/Psychiatric/SUD/Social/Family History:   ?  Please see previous psychiatry note from Dr. Mechele Claude, and dr. Henderson Newcomer Moden/   + reportedly chronic glaucoma (diagnosed at age 3, 8 years ago), with nightly latanoprost drops bilaterally. Also reports history of mitral valve prolapse and mitral regurgitation, followed by cardiology, no medications.  ?  Currentl Medications:   ?  Standing:  latanoprost, 1 drop, Both Eyes, nightly    ?  ?  PRN: none  ?  ?  ROS   ?  A focused ROS was performed and was negative unless otherwise stated in the HPI  ?  Vitals   ?  Visit Vitals  BP (!) 135/99 (BP Location: Left arm, Patient Position: Sitting)   Pulse 85   Temp 36.7 ?C (98.1 ?F) (Oral)   Resp 18   ?  ?  Exam   ?  Physical Exam: Please see note from primary team from 09/15/20  ?  Mental Status Evaluation:  ?  Appearance: lying in bed, sleeping; once woken, awake, alert, NAD, good grooming and hygiene, wearing eyeglasses, gown  Behavior: Pleasant,  cooperative  Eye Contact: fair, occasionally looking down  Speech: Normal in rate, volume, and tone  Mood: "pretty bad"  Affect: dysphoric, tearful, mood congruent, somewhat restricted   Thought Process: Logical, linear  Thought Content: Devoid of obviously delusional content  SI/HI: SI with plan to cut her wrist in the bathtub or overdose on pills  Sensorium/Perceptions: does not appear to be RTIS  Insight/Judgement: fair insight, fair judgment   ?  Cognitive Exam:   ?  Orientation/Sensorium: intact to observation, not formally assessed  Fund of Knowledge: Average  Language: Normal  ?  EKG   ?  No results found for  this or any previous visit (from the past 4464 hour(s)).  ?  Labs   ?  CBC, BMP wnl  UTox positive for cannabinoids  Blood tox negative  ?  Imaging   ?  Reviewed, none  ?  Diagnoses   ?  Unspecified depressive disorder  ?  Impression   ?  Becky Ho is a 30 y/o female with past history of what seems like unipolar depression, anxiety with panic attacks, SIB, and chronic suicidal ideation presenting to the ED with SI with plan to cut her wrists. Endorses chronic thoughts of suicide, exacerbated by recent stressors of breaking up with her girlfriend, and the recent anniversary of her father's birthday (who died when she was 6). Continues to endorse SI with plan, noting that she would not feel safe taking pills that she could overdose on. On exam, appears dysphoric, tearful at times, and does not feel safe leaving the hospital. Meets criteria for inpatient psychiatric level of care, bed search is ongoing. Had previous partial medication trial with Wellbutrin which was she felt helped with ability to get up and do things, but did not lessen depressive symptoms or suicidal thoughts. Not currently taking any psychiatric meds; will start escitalopram at low dose of 5mg  for a few days, then taper up. Denies history of manic episodes. Also reports difficulty sleeping, but reluctant to take sleeping pills at the moment.   ?  Safety Assessment:  Regarding safety, the patient denies thoughts of harm to self or others. The patient has risk factors that include previous self-harmactive SI, anxiety, impulsivity and active depression. Protective factors include help-seeking behavior. In considering these factors, the patient is at acute risk for intentional harm to self or others.  ?  Recommendations/Plan   ?  #Unspecified depressive disorder  - S12 in chart, cannot leave AMA  - recommend starting escitalopram 5mg  daily  - meets [facility], bed search ongoing  - Covid negative  - f/u EKG  ?  #Glaucoma  - continue home latanoprost drops  bilateral eyes, nightly

## 2020-09-15 NOTE — Nursing Note (Signed)
 New admit  Patient arrived to floor, cooperative with all care.  States that she is still having some suicidal thoughts but is hoping to have a medication readjustment.  Reports stress related to a recent breakup, an anniversary of the death of her father and stress at work.  States that she does have a family history of addiction (both grandparents) and BPD (2 cousins).  Signed in after consulting with her sister.  Assured that she would be dcd when she became stable.  Ordered dinner ( patient is a pescatarian).  Spending time on phone with her sister

## 2020-09-16 LAB — MAGNESIUM: Magnesium: 2.1 mg/dL (ref 1.6–2.6)

## 2020-09-16 LAB — VITAMIN B12: Vitamin B-12: 624 pg/mL (ref 193–986)

## 2020-09-16 LAB — LIPID PANEL
Cholesterol/HDL Ratio: 3.4
Cholesterol: 152 mg/dL (ref <=200)
HDL cholesterol: 45 mg/dL (ref >=40)
LDL cholesterol, calculated: 97 mg/dL (ref 0–130)
Triglycerides: 50 mg/dL (ref <=150)

## 2020-09-16 LAB — PHOSPHORUS: Phosphorus: 3.8 mg/dL (ref 2.4–4.9)

## 2020-09-16 LAB — HEMOGLOBIN A1C: HEMOGLOBIN A1C % (INT/EXT): 5.1 % (ref <5.6)

## 2020-09-16 LAB — FOLATE: Folate: 17.4 ng/mL (ref >=3.5)

## 2020-09-16 LAB — TSH WITH REFLEX: TSH: 1.04 u[IU]/mL (ref 0.35–4.94)

## 2020-09-16 MED ORDER — lithium ER (Lithobid) 12 hr tablet 300 mg
300 | Freq: Every evening | ORAL | Status: DC
Start: 2020-09-16 — End: 2020-09-17
  Administered 2020-09-17: 01:00:00 300 mg via ORAL

## 2020-09-16 MED FILL — LATANOPROST 0.005 % EYE DROPS: 0.005 0.005 % | OPHTHALMIC | Qty: 2.5

## 2020-09-16 MED FILL — ESCITALOPRAM 5 MG TABLET: 5 5 mg | ORAL | Qty: 1

## 2020-09-16 NOTE — Nursing Note (Signed)
 7pm-11pm: Patient was given education material on lithium and HSV. We discussed some of her concerns in regards to her friends finding out she is HSV positive. Discussed pros and cons about self disclosing such personal information to friends and intimate partners. Patient was open and receptive to the discussion. She denies SI at this time. First dose Lithium administered, no adverse reactions observed or reported.

## 2020-09-16 NOTE — Progress Notes (Signed)
 CC: "I'm ok, I want to get better"    Interval hx:  30 yo w/ h/o MDD, and 10 years of depression with suicidal thoughts, significant major losses, admitted for her plan to cut her wrists because she didn't have access to pills.   + doing well on the unit  + poor appetite observed and reported  + calm, cooperative, somewhat isolative  + discussed groups  + Discussed medication treatment  + discussed concern of loosing job, assured letter to her current job can be submitted by the team on Monday if needed  ??  ROS:  A focused ROS was performed and was negative unless otherwise stated in the HPI    Vitals:  Reviewed    Mental Status Evaluation:  ?Appearance: sitting in bed, sleeping, alert, NAD, good grooming and hygiene, wearing eyeglasses, gown  Behavior: Pleasant, cooperative  Eye Contact: fair, occasionally looking down  Speech: Normal in rate, volume, and tone  Mood: "pretty bad"  Affect: dysphoric, tearful, mood congruent, somewhat restricted   Thought Process: Logical, linear  Thought Content: Devoid of obviously delusional content  SI/HI: SI with plan to cut her wrist in the bathtub or overdose on pills  Sensorium/Perceptions: does not appear to be RTIS  Insight/Judgement: fair insight, fair judgment   Cognitive Exam:   ?Orientation/Sensorium: intact to observation, not formally assessed  Fund of Knowledge: Average  Language: Normal  ?  Impression:  Becky Ho is a 30 y/o female with past history of what seems like unipolar depression, anxiety with panic attacks, SIB, and chronic suicidal ideation w/ current active plan to cut her wrists.   No manic/psychotic sy  No substances  No major medical issues  ?  Plan:   escitalopram 5mg  daily for 2 days, then increase to 10mg  thereafter  Start Lithium low dose treatment for augmentation and suicide (chronic, currently active) prevention   f/u EKG  continue home latanoprost drops bilateral eyes, nightly  Encourage groups
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
## 2020-09-17 LAB — TREPONEMA PALLIDUM (SYPHILIS) AB (RPR): T pallidum Antibodies: NONREACTIVE

## 2020-09-17 MED FILL — LITHIUM CARBONATE ER 300 MG TABLET,EXTENDED RELEASE: 300 300 mg | ORAL | Qty: 1

## 2020-09-17 MED FILL — ESCITALOPRAM 5 MG TABLET: 5 5 mg | ORAL | Qty: 1
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
## 2020-09-17 NOTE — Progress Notes (Signed)
 PSYCHIATRY ATTENDING PROGRESS NOTE    Admitted Saturday 7/16. Record reviewed.    CC/circumstances of admission: SI    History: Asked about her medications this morning and used the term "SSRI." Wanted to put her mother on speaker in rounds.   ROS: Sleep, reviewed. See also above.    VS: Reviewed  Muscle tone and strength: MAE  Gait and station: Normal    On mental status examination, the patient was awake and alert. Appearance: Neat, clean, wearing a colorful face mask. Behavior: Calm, cooperative. Speech: Volume normal, rate normal, rhythm normal, latency normal. Mood: OK. Affect: Quiet. Thought process: Goal normal, associations normal, rate normal. Thought content was devoid of spontaneous suicidal, homicidal, violent, and delusional material. There was no evidence of hallucinations.    Labs: Reviewed    Impression & Plan    ? Unspecified depressive disorder. Working diagnosis. Differential includes dysthymia, adjustment disorder, and personality disorder. Start diagnostic assessment. Collect collateral data. Continue or stop escitalopram. I stopped lithium because I think the risk outweighs the benefit. Consider lifting unit restriction. Consider a lockbox on discharge.  ? Cannabis use disorder.  ? Glaucoma.  ? Mitral valve prolapse. Mitral regurgitation.    COVID vaccine status: Vaccinated. Not boosted.    Legal status: CV    Dannielle Karvonen, M.D.

## 2020-09-17 NOTE — Nursing Note (Addendum)
 Patient ate breakfast at bedside.  Met with team.  Patient wanting to put her mother on speaker phone during team meeting.  Patient advised to wait until she was meeting privately with her treater.  Patient asking appropriate questions.  Lithium discontinued.  "25% of me wishes I went through with it."  Patient reports she has felt depressed for years.  Patient happy Mom is in town and will visit her.  Patient thinking about moving back to [place] where all her supports are.  Currently lives with a roommate that she is in conflict with.      3-7pm.  Patient using her cell phone appropriately in her room.  Ate dinner at bedside.

## 2020-09-17 NOTE — Nursing Note (Signed)
 7-11 PM    Calm. Cooperative. No unsafe behaviors observed. Pt spent evening in her room using her cellphone. She signed ROI for her mother Sudan and her sister Bufford Lope. Compliant with Xalatan eye drops.
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
## 2020-09-17 NOTE — Group Note (Signed)
 Group Name: Community Meeting   Group Date: 09/17/2020  Start Time: 1030  Facilitators: Ninfa Linden, OT   Department: [facility] Psychiatry Unit    Group Topic: Orientation to time, place, unit, other patients/staff. Establish daily goals, review group schedule. Provide education of unit issues and policies.   Goal of Group: Increase cognitive orientation, promote socialization   Group Content: Activity and Discussion    Name: Becky Ho Date of Birth: 08/29/1990   MR: [id number]      Attendance: Attended  Participation: Full  Affect: Appropriate  Behavior: Cooperative and Verbal  Thought Process: Clear/Logical, Alert and Distracted  Additional Supports: Clarifying  Safety Supports:  None Required  Patient Response to Intervention:   Pt was generally attentive though distractible at times. Stated that her goal was to be more positive,not to dwell on negative thoughts. Fully participated in relaxation/stretch activities. No specific reference to current SI/Depressive symptoms. Quiet,withdrawn.

## 2020-09-17 NOTE — Initial Assessments (Signed)
 Psychiatric Occupational Therapy Initial Assessment     Patient Name: Becky Ho  MRN: [id number]  DOB: Dec 20, 1990  Today's Date: 09/17/2020     Pt reported goal: "I want to get better at coping skills, and stop having suicidal ideations."    Past Medical History:   Diagnosis Date   ? Glaucoma    ? Mitral valve prolapse    ? Mitral valve regurgitation        Admisson History  Sources of Information:: Patient  Interpreter Offered:: Not required  Was patient involved in the assessment?: Yes  OT Precautions: Severe emotional dysregulation  Target Symptoms: Anxiety, Suicidal Ideation, Depressed Mood    Mental Status Exam  Orientation: All of the above  Current Thought Process: Alert  Appearance: Well groomed  Affect: Flat, Appropriate    ADLs (Current)  Feeding: Independent  Grooming: Independent  Toileting: Independent  Bathing: Independent  Dressing: Independent    Functional Mobility (Current)  In/Out Bed: Independent  Transfers: Independent  Ambulation: Independent    Dominant Hand  Which is your dominant hand?: Right    Left Upper Extremity  ROM : WFL  Strength: WFL  Sensation: WFL  Fine Motor: WFL    Right Upper Extremity  ROM: WFL  Strength: WFL  Sensation: WFL  Fine Motor: WFL    Present Living Situation  Type of Residence: Apartment  Lives With : Roommate  Environment Comment: Lives in [place], [place] with her roommate (recently got into an argument and feels like she has no where to live).  Assistive Technology: no  Support Systems: Parent, Family members, Friends/neighbors, Other (Comment) (Limited supports. Most family/friends live in [place])    IADLS (Current)  Doing Housework: Independent  Taking Medication: Independent  Community Activities: Independent  Managing Finances: Independent  Safety: Independent  Care of Dependents: Not Applicable  Meal Preparation: Independent  Using Transportation: Independent  Still Driving: Unable to assess  Daily Structure Comment: works 9-5 with some weekends  Leisure Interests:  Financial controller, Music, Reading    Social History  What is the highest level of school you have completed?: Bachelor's degree (e.g. 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  Social History Comments: Does not like her job, has interrelationship issue with others.    Safety Tool  Safety Tool Completed: Completed  Safety Tool Completion Date: 09/17/20    OT Interventions  OT Interventions: Assist with daily structure and routine, Encourage groups and monitor target symptoms/response, Encourage participation in ADLs and monitor ADL status, Promote participation/involvement in reality based, purposeful goal-directed activities       Vena Austria, [place]

## 2020-09-18 MED ORDER — escitalopram (Lexapro) tablet 10 mg
5 | Freq: Every day | ORAL | Status: DC
Start: 2020-09-18 — End: 2020-09-21
  Administered 2020-09-19 – 2020-09-21 (×3): 10 mg via ORAL

## 2020-09-18 MED FILL — ESCITALOPRAM 5 MG TABLET: 5 5 mg | ORAL | Qty: 1

## 2020-09-18 NOTE — Significant Event (Signed)
 Pt admit with SI., recent break up with S.O., hx depression, bipolar DO.   CM will follw and asssist as needed

## 2020-09-18 NOTE — Progress Notes (Signed)
 PSYCHIATRY PROGRESS NOTE:  Date: 09/18/2020, 1:26 PM    Interval History:     Much of our discussion centered around Dwayna's feelings that she has no sense of agency in her life. She says that she is unable to draw boundaries and say "no" to others, which leads to her feeling constantly emotionally drained. When pressed on why she feels she cannot say no, she gives many factual reasons about being responsible for many things as a child (e.g. administering her demented grandfather's insulin when she was 11-12), but has difficulty articulating what she thinks the outcome will be were she to refuse doing something. She offers many explanations: that she wouldn't be as valuable to her family, that they wouldn't love her as much, that she needs to prove that she isn't just the pretty one, etc. I observed the futility in her striving to earn such approvals if the required behavior caused her to become suicidal.    She is fretting about her mother's pressure on her to return home to [place] as she is afraid that she will feel even more burdened with things she feels she cannot refuse to do for her family. She would prefer to move into a currently empty family house in [place], but says her mother is against this.    Regarding her suicide notes: She planned to send them as emails and wrote at least some of them out on her computer. The first one was to be sent to her uncle, as she felt he was the strongest in the family and would be able to best cope with having to tell the family. She also planned to write (or wrote? Unclear) letters to her mother, brother, and sister telling them that she loved them and not to blame themselves. Additionally, there was a "Last Will and Testament" she wrote that described the apportionment of her belongings between her mother, younger brother, and sister in detail.    I asked her if she would come back if I gave her a fresh air break. She made a face that implied "obviously," and said  "I'm not going to go anywhere in the latest hospital fashion."    Visit Vitals  BP 132/84 (BP Location: Right arm)   Pulse 102   Temp 36.8 ?C (98.2 ?F) (Temporal)   Resp 16                               Mental Status Evaluation:    Appearance: Well groomed female, hair up.  Behavior: She is quite pensive and considerate of the questions I ask her.  Speech: Normal in rate, volume, and tone  Mood: "Okay"  Affect: Mildy defeated and constricted  Thought Process: Logical, linear  Thought Content: Devoid of obviously delusional content  SI/HI: No stated HI/SI  Perceptions: Not obviously responding to internal stimuli  Insight/Judgement: Middling    Interval Labs:    No results found for this or any previous visit (from the past 24 hour(s)).    Impression:     I am finding the true sincerity of Bettylou's suicidal ideation hard to suss out. On one hand she describes a rather carefully planned suicide with a selected date, location, plans on who to send her (already written) suicide notes to, and a detailed will. On the other hand there are some elements of her story that don't quite match up, such as her plan to send an email to  her Kateri Mc first has the obvious problem that any email sent to her mom/sister/brother would arrive so soon after as to not make a difference. She also seems future oriented in my discussions with her and her reaction to my fresh air break question was automatic. My overall sense is that she is not currently suicidal.    Otherwise, my opinion of Sherea's core problem is her complete inability to set boundaries with others. I would like to help her crystallize what she thinks is the primary reason behind this deficit so we can start to chip away at it, as well as figuring out what concrete steps she can take to make it easier for her to say "no."    Plan:     # Dysthymia  # Suicidal Ideation  ? Increase escitalopram to 10mg  qDay    # Glaucoma  ? Latanoprost 0.005% 1 drop, both eyes,  qHS    #Bundle  COVID: Fully vaccinated, next booster due in August  Legal: Cond Vol - Patient (ADM)  FAB: Yes, w/ Section 12  Diet: Pescaterian    Signed: Eliot Ford, MD

## 2020-09-18 NOTE — Group Note (Signed)
 Group Name: Alternative Activity   Group Date: 09/18/2020  Start Time: 1400  Facilitators: Vena Austria, OT; Ninfa Linden, OT   Department: [facility] Psychiatry Unit    Patient declined to participate in all OT groups on this date.     Name: Becky Ho Date of Birth: 1991-01-07   MR: [id number]

## 2020-09-18 NOTE — Nursing Note (Signed)
 Pt cooperative with assessment, in room most of the shift talking and laughing with friend on the phone, good PO intake, No SI, reports feeling good. Mother came to visit. PCP wants Attending to call tomorrow to discuss Pt- Dr Kathleen Lime [phone].
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
## 2020-09-18 NOTE — Progress Notes (Signed)
 PSYCHIATRY ATTENDING PROGRESS NOTE    I have reviewed the progress notes--including any further history or collateral information obtained--since my last note.    CC/circumstances of admission: SI    History: Feels "a bit tired." Mother, from [place], visited yesterday. Enjoyed seeing her.   ROS: Sleep, reviewed. See also above.    VS: Reviewed  Muscle tone and strength: MAE  Gait and station: Normal    On mental status examination, the patient was awake and alert. Appearance: Neat, clean, wearing a colorful face mask, hospital gown, and scrub bottoms. Behavior: Calm, cooperative. Speech: Volume low, rate normal, rhythm normal, latency normal. Mood: As above. Affect: Quiet. Thought process: Goal normal, associations normal, rate normal. Thought content was devoid of spontaneous suicidal, homicidal, violent, and delusional material. There was no evidence of hallucinations.    Labs: Reviewed    Impression & Plan    ? Unspecified depressive disorder. Working diagnosis. Differential includes dysthymia, adjustment disorder, and personality disorder. Continue diagnostic assessment. Get the suicide letters she wrote. Collect collateral data (mother, therapist). Increase escitalopram and we will tell her that this is not going to do all the work. Consider lifting unit restriction. Consider a lockbox on discharge.  ? Cannabis use disorder.  ? Glaucoma. Continue latanoprost.  ? Mitral valve prolapse. Mitral regurgitation.    COVID vaccine status: Vaccinated. Not boosted.    Legal status: CV    Dannielle Karvonen, M.D.

## 2020-09-18 NOTE — Nursing Note (Addendum)
 Patient came to rounds and stated that she had not slept well.  Happy that she was able to see and talk with her mother.  Felt supported.  Moderate po intake  Med compliant  Asked for and received some of the belongings sent in by her mother.    Showered.  Looking forward to visiting with her mother tonight.

## 2020-09-19 MED FILL — ESCITALOPRAM 5 MG TABLET: 5 5 mg | ORAL | Qty: 2

## 2020-09-19 NOTE — Discharge Summary (Signed)
 Becky Ho 2 Discharge Summary    Date of Admission: 09/14/2020  Date of Discharge: 09/21/20  LOS: 6 days    Associated Diagnoses:  Active Problems:    Glaucoma    Dysthymia      Initial Psychiatric Assessment:     HPI:      Becky Ho is a 30 y.o. female, with a history of depression, anxiety with panic attacks, SIB, and chronic suicidal ideation who presents to [facility] from [facility] ED (self-presented) with SI and plan to cut her wrists.      She describes a very specific plan to rent a hotel room so that her roommate wouldn't have to find her body, run a warm bath, and use an exactoknife to slit her wrists. Has an exactoknife at home from previous college art projects. She states that previously, her suicidal thoughts centered around overdosing on pills, since she thought that would be a less painful method, but that on the day of admission, she did not have access to pills.     On admission to [facility] reports improved mood relative to when she presented to the ED. She states that she feels better after talking to her sister, is happy that her mother is coming from [place]  to see her, and is feeling more hopeful now that she is taking steps to get back on anti-depressants.     History Summary:     Psychiatric history of dysthymia and restrictive eating in high school, self-harm with her fingernails. Medical hx significant for mitral valve prolapse/regurg, and open-angle glaucoma. Originally from [place], [place] but recently moved to [place] and has a roommate that she had a falling out with. Works as a Health and safety inspector. Lesbian, recent breakup with girlfriend in Feb. 2022. Daily marijuana use for eye pain, otherwise no significant history of problematic substance use.    No medications prior to admission.       Initial Mental Status Examination: Remarkable for dysphoric affect, suicidal ideation with plan to slit her wrists as described above.    Initial Laboratory and Imaging Findings: On admission to [facility], bHCG,  CBC, LFTs, CMP, Lipid Panel, A1c, B12, TSH, folate, and antitreponemal antibody studies were unremarkable.    Initial Psychiatric Working Diagnosis was: Suicidal ideation [R45.851]  Depression, unspecified depression type [F32.A]    Inpatient Hospital Course and Treatment Summary     Legal Status: CV     1) Suicidal ideation, Dysthymia    Further history obtained from Becky Ho and collateral indicated a long, non-episodic history of low mood, insomnia, and fatigue consistent with dysthymia. This was exacerbated by a series of recent psychosocial stressors that included social isolation due to a recent move to [place], a falling out with her roommate/former best friend, and resulted in suicidal ideation with a fairly detailed plan to end her life that included pre-written suicide emails and a will detailing the distribution of her belongings.     From a psychologic perspective, Becky Ho seems to struggle quite intensely with drawing boundaries and saying "no" to requests from family and friends. This has lead to a sense of personal helplessness and burnout due to her intense fear of the consequence of drawing those boundaries, and had not developed the insight that the cost of drawing them might be less than the emotional toll not drawing them was taking on her.    Overall she was an excellent patient and demonstrated good behavior on the unit. She attended group and morning  rounds. She did not require any PRNs or restraints. She was started on escitalopram and tolerated it well - her discharge dosage was 10mg . At the time of discharge she showed good improvement in the brightness and reactivity of her mood, was no longer suicidal, and was very future oriented.    2) Open Angle Glaucoma    She was continued on her home latanoprost      Mental Status Exam at the Time of Discharge:    BPRS on Admission: 37   BPRS on Discharge: 20     Appearance: Young black female, in hospital clothes with brightly colored mask, in hospital  clothes, sitting, in no acute distress  Behavior: Pleasant, cooperative  Speech: Normal in rate, volume, and tone  Mood: "Good"  Affect: appropriate  Thought Process: Logical, linear  Thought Content: Devoid of obviously delusional content  SI/HI: Denies HI/SI  Perceptions: Not obviously responding to internal stimuli  Insight/Judgement: Normal    Final psychiatric working diagnosis: Unspecified Depressive Disorder, though Dysthymia, Adjustment Disorder, Personality Disorder remained in the differential.    Psychiatric Medication Treatment Summary:    Medication Dosing Duration of Tx  Notes   Escitalopram 10mg  qDay 09/15/20 - present                    Participation in Care & Safety:     During this admission, the patient attended groups, attended daily rounds, and remained visible on the unit  They did not require PRN medications  During hospitalization the patient did not have restraint or seclusion events.    At time of discharge denied SI/HI and denied hallucinations. Patient was instructed to return to emergency department if having worsening or emergent psychiatric symptoms such as acute suicidality.         Discharge Plan/Disposition     Patient was discharged to the care of her mother, with plan to return to [facility]  Post-discharge care included f/u care with her PCP in [place]

## 2020-09-19 NOTE — Unmapped (Incomplete)
Discharge Diagnosis  Suicidal ideation    Hospital Course    09/14/20: Presented to ED with SI and plan to check into hotel and cut wrists with razor. UDS positive for cannabinoids. Psychiatry consult (Dr. Aron Baba, Dr. Tonny Bollman) recommended S12, patient cannot leave AMA. Continue home latanoprost.     09/15/20: Seen by psychiatry (Dr. Jenean Lindau, Dr. Ellyn Hack) in ED, recommended starting escitalopram 5 mg daily. Continue home latanoprost. Signed in CV. Admitted to [facility].     09/16/20: Seen by psychiatry (Dr. Ellyn Hack) on [place] [facility]. Started lithium low dose for augmentation and suicide prevention. Continue escitalopram 5 mg daily and home latanoprost.     09/17/20: Attended morning rounds with Dr. Winferd Humphrey. Lithium was discontinued. Diagnostic assessment with Dr. Caro Hight. Attended community meeting, initial assessment, safety tool with OT. Mother visited from [place]. Continue escitalopram 5 mg daily and home latanoprost.     09/18/20: Attended morning rounds with Dr. Winferd Humphrey. Escitalopram was increased to 10 mg daily. Continue home latanoprost. Diagnostic assessment with Dr. Caro Hight. Collateral obtained from mother and therapist. Mother visited.     09/19/20: Attended morning rounds with Dr. Winferd Humphrey. Denied SI. Ready to go home. Continue escitalopram 10 mg daily and home latanoprost.     09/20/20: Attended morning rounds with Dr. Winferd Humphrey. Denied SI. Ready to go home. Continue escitalopram 10 mg daily and home latanoprost.     Test Results Pending At Discharge    Pertinent Physical Exam At Time of Discharge  Physical Exam    Issues Requiring Follow-Up  Coping mechanisms for negative thoughts and inability to set boundaries.     Outpatient Follow-Up  No future appointments.

## 2020-09-19 NOTE — Progress Notes (Signed)
 SW met with pt to assess pt and discuss pt's plans after she discharges from the hospital. Pt discussed that she needed to terminate a lease she just signed with her roommate starting in August, as well as her plan to permanently relocate back to [place], [place] where she has more socials supports and can live with her mother until she can get herself her own apartment.     Pt will resume care with her established PCP in [place], [place] and pt's PCP will make a new therapy referral. SW and pt discussed that the pt may benefit from joining LGBTIA+ and body positivity groups. Pt was receptive and SW will follow-up with pt and provide information on local groups.

## 2020-09-19 NOTE — Nursing Note (Addendum)
 Patient states she notis feeling suicidal today.   Attributes her improved mood to her mother assuring her that she should think more positively and that things would be ok.  Hoping to rid herself of the toxic people in her life.   Upset about her roommate hovering over her last night and needing to sleep in the comfort room.  Happy that her roommate will be dcd today.  Mother called looking for an update and says that she will be in to visit tonight.  Spent most of the day in the comfort room.  At 2:30p stated she had to lay down.  Offered open quiet room until her roommate is discharged later today.

## 2020-09-19 NOTE — Progress Notes (Signed)
 PSYCHIATRY ATTENDING PROGRESS NOTE    I have reviewed the progress notes--including any further history or collateral information obtained--since my last note.    CC/circumstances of admission: SI    History: Asked if she was still suicidal, she said no. She is trying to think more positively and making discharge plans with her mother. She said she has to remove some people from her life for her own improved mental health. She wants to be able to leave the hospital soon.   ROS: Sleep, reviewed. See also above.    VS: Reviewed  Muscle tone and strength: MAE  Gait and station: Normal    On mental status examination, the patient was awake and alert. Appearance: Neat, clean, wearing a colorful face mask. Behavior: Calm, cooperative. Speech: Volume normal, rate normal, rhythm normal, latency normal. Mood: As above. Affect: Full. Thought process: Goal normal, associations normal, rate normal. Thought content was devoid of suicidal, homicidal, violent, and delusional material. There was no evidence of hallucinations.    Labs: Reviewed    Impression & Plan    ? Unspecified depressive disorder. Working diagnosis. Differential includes dysthymia, adjustment disorder, and personality disorder. Continue diagnostic assessment. Collect collateral data (mother, therapist). Continue escitalopram. Continue fresh-air breaks. Consider a lockbox on discharge.  ? Cannabis use disorder.  ? Glaucoma. Continue latanoprost.  ? Mitral valve prolapse. Mitral regurgitation.    COVID vaccine status: Vaccinated. Not boosted.    Legal status: CV    Dannielle Karvonen, M.D.

## 2020-09-19 NOTE — Nursing Note (Signed)
 Patient tired this evening d/t interrupted sleep caused by her roommate.  Sleeping on and off throughout the shift.  Denies SI.  Reports of feeling "better".  Visit from mom this evening.  Patient discussed plan to move back to [facility] and stay with her mother.  States, " I just need some support right now."  Patient looking forward to probable discharge on Friday.

## 2020-09-19 NOTE — Progress Notes (Signed)
 Pt lives with roommate in [place]. Mother and sister are from out of town, not nearby. Has PCP in [place]. Pt is independent with all activities, works   Therapist: [facility] with [facility].    ADmit with SI with plan. Triggers; conflict with roommate, Ofilia Benton of deceased  fathers B-day. Breakup with SO. See H&P.  Pt insurance [employer] [place].  See [employer]or Communication for details.   Tx plan: Continue diagnostic assessment, collect collateral data ( Mother, sister)   DC plan: Home with outpt F/U, appointments with new Psychiatrist  F/U with therapist. No VNA coverage with [employer].   CM to  Assist as needed. Will follow.

## 2020-09-19 NOTE — Nursing Note (Signed)
 Pt slept from 12:30 to 1:30 in her bed.  Approached staff around 0145 reporting that her roommate took away her dinner tray and put it on her side of the room.  Becky Ho also reported that she was sleeping with her pillow on her head/face because the roommate keeps her light on, and the roommate came and took the pillow off of her face and that her roommate was sitting on Becky Ho's bed watching her sleep.  Pt did not want to return to her room to sleep and was offered the comfort room.  She then slept from 2:15--on, in the comfort room.   Slept approximately 5 hours overnight.

## 2020-09-19 NOTE — Discharge Instructions - Appointments (Addendum)
 Dr. Mitzie Anda  Monday, September 24, 2020 at 9:30 AM   [address]  [place], [place]  Tel: [phone]  Fax: [phone]  *This is a follow up with your primary care physician. Their office spoke with SW and is willing to refer you to therapists accepting new patients. Please discuss this with your PCP at your visit.       [facility]  [facility]  [address], [place] [address]  Tel: [phone]  FAX:   *This location runs an IOP program and groups for adults suffering from depression. They will contact you on your cell phone to schedule your groups and/or IOP.    *Please reach out to the specialized peer social groups you were provided information on; they are an important part of your discharge plan.    Resources:    If you are experiencing a mental health emergency, feel like you want to harm yourself or someone else, please call 911, go to the nearest emergency room or call:     National Suicide Hotline: (929) 734-9978     [facility] Mental Health Services-24-Hour Hotline: 5870346372. Our 24/7 Call Center is staffed by Master's level clinicians who will triage calls, provide support, make appropriate referrals via phone, in-person evaluations, or Telehealth. You will always speak to a live clinician. To initiate services, call the toll-free 24-Hour Hotline: 1-800-981-HELP 9727190896).     [facility] (call or text): 513-773-3697     For additional education, resources, support and groups please visit:     nami.org     [facility].org    GET CONNECTED TO SOCIAL SERVICES IN [place] BY CALLING 211  Behavioral Health  Call2Talk  Child Care Services  Child Care Wait List Registration  Children Requiring Assistance  Domestic Violence Help  Emergency/Disaster  Food Security  Housing/Shelter  Runaway Assistance Program (RAP)  Transportation  Utility Assistance  Youth Homelessness

## 2020-09-20 ENCOUNTER — Other Ambulatory Visit

## 2020-09-20 MED ORDER — latanoprost (Xalatan) 0.005 % ophthalmic solution
0.005 | Freq: Every evening | OPHTHALMIC | 1 refills | 45.00000 days | Status: AC
Start: 2020-09-20 — End: 2020-11-19
  Filled 2020-09-21: qty 2.5, 28d supply, fill #0

## 2020-09-20 MED ORDER — escitalopram (Lexapro) 10 mg tablet
10 | ORAL_TABLET | Freq: Every day | ORAL | 0 refills | Status: AC
Start: 2020-09-20 — End: 2020-10-06
  Filled 2020-09-21: qty 15, 15d supply, fill #0

## 2020-09-20 MED FILL — ESCITALOPRAM 5 MG TABLET: 5 5 mg | ORAL | Qty: 2

## 2020-09-20 NOTE — Progress Notes (Signed)
 PSYCHIATRY ATTENDING PROGRESS NOTE    I have reviewed the progress notes--including any further history or collateral information obtained--since my last note.    CC/circumstances of admission: SI    History: Feels "tired." Ready for discharge tomorrow. Will go to [place] with mother.   ROS: Sleep, reviewed. See also above.    VS: Reviewed  Muscle tone and strength: MAE  Gait and station: Normal    On mental status examination, the patient was awake and alert. Appearance: Neat, clean, wearing a colorful face mask, hospital gown, and scrub bottoms. Behavior: Calm, cooperative. Speech: Volume normal, rate normal, rhythm normal, latency normal. Mood: As above. Affect: Full. Thought process: Goal normal, associations normal, rate normal. Thought content was devoid of suicidal, homicidal, violent, and delusional material. There was no evidence of hallucinations.    Labs: Reviewed    Impression & Plan     Unspecified depressive disorder. Working diagnosis. Differential includes dysthymia, adjustment disorder, and personality disorder. Collateral data from mother and therapist to be documented today. Continue escitalopram. Continue fresh-air breaks. Discharge tomorrow.   Cannabis use disorder.   Glaucoma. Continue latanoprost.   Mitral valve prolapse. Mitral regurgitation.    COVID vaccine status: Vaccinated. Not boosted.    Legal status: CV    Claudean Crumbly, M.D.

## 2020-09-20 NOTE — Nursing Note (Signed)
 Patient calm and cooperative.  Reports feeling ready for discharge. Denies SI/HI. Isolative to room.  Visit from mother this evening.  Plan for d/c tomorrow-will go home to [place] with her mom.

## 2020-09-20 NOTE — Progress Notes (Signed)
 PSYCHIATRY PROGRESS NOTE:  Date: 09/20/2020, 12:47 PM    Interval History:     Attended morning rounds. Denied suicidal ideation. Agreed to have her mom pick her up by 11 am. She said it was great seeing her mom and she is ready to go home.     Her grandmother with dementia will be home but Becky Ho plans to go to "queer space" and the library to get out of the house during the day. She also plans to reconnect with members of her sorority because she is reevaluating her current relationships. She still wants to work in an area with sex therapy and go to graduate school. She is going to apply to other jobs closer to home and does not plan to go back to [place]. She plans to call her roommate tonight at 6 pm to tell her that she is not moving back but will cover the rent while she looks for a sublet. She plans to work on her breathing and get a stress ball as a coping mechanism.      Visit Vitals  BP 126/76 (BP Location: Left arm)   Pulse 78   Temp 36.6 C (97.9 F) (Temporal)   Resp 16                               Mental Status Evaluation:    Appearance: Well groomed female, hair up, eating an orange.   Behavior: Pleasant, cooperative  Speech: Normal in rate, volume, and tone  Mood: "Better"  Affect: Euthymic  Thought Process: Logical, linear  Thought Content: Devoid of obviously delusional content  SI/HI: Denies SI/HI  Perceptions: Not obviously responding to internal stimuli  Insight/Judgement: Fair    Interval Labs:    No results found for this or any previous visit (from the past 24 hour(s)).    Impression:     Overall Becky Ho has measurably improved since admission. She is brighter, her affect is no longer predominantly dysphoric, her suicidal ideation has resolved, and she has been very future oriented as indicated by our discussion above. Will plan for discharge tomorrow with her mother at 11am.    Plan:     # Dysthymia  # Suicidal Ideation (resolved)   Escitalopram 10 mg qDay    # Glaucoma   Latanoprost 0.005%  1 drop, both eyes, qHS    #Bundle  COVID: fully vaccinated, booster in August  Legal: Cond Vol - Patient (ADM)  FAB: yes  Diet: pescatarian      Signed: Nicoletta Barrier, MD

## 2020-09-20 NOTE — Nursing Note (Signed)
 Pleasant and cooperative. Isolative and keeping to herself. Denies SI/HI. Reports feeling "better." Happy about discharge tomorrow back to [place].

## 2020-09-20 NOTE — Progress Notes (Signed)
 Case Management Progress Note: RNCM reviewed patient's chart and saw patient during morning rounds. Per team patient has been making good progress and is going to be discharged tomorrow to her mother's home. Mom will be picking her up.

## 2020-09-20 NOTE — Progress Notes (Signed)
 SW spoke to pt's sister Ashyia Schraeder who expressed concern over her sister's meds and wanted to confirm that the pt's psychiatrist had spoken to pt's PCP, Dr. Luane Rumps. She also shared that pt had been receiving harassing texts from her roommate and another friend while on unit, and asked the SW to check-in with the pt to explore possible ways to handle that situation. Sister also expressed concern over pt's discharge plan, but expressed relief when SW talked her through the current plan. Sister reported that pt told her that she is concerned over a potential HIPAA violation after receiving a care package and letter from her boss addressed to the pt on "[facility]".      SW checked-in with pt, reviewed the letter, and reported the potential incident to [place] who was already made aware of the situation. SW will revisit the discharge plan and peer support materials with the patient before discharge. All appointments are scheduled for pt's discharge.

## 2020-09-21 ENCOUNTER — Other Ambulatory Visit

## 2020-09-21 MED FILL — ESCITALOPRAM 5 MG TABLET: 5 5 mg | ORAL | Qty: 2

## 2020-09-21 NOTE — Nursing Note (Signed)
 At time of discharge, pt denies SI. She reviewed and signed her discharge summary but declined to sign the Authorization to [employer]. Pt acknowledged receipt of all personal belongings. She received meds to bed. Pt was discharged from [facility] [facility] at 1130.

## 2020-09-21 NOTE — Progress Notes (Signed)
 Case Management Progress Note: Patient is cleared for discharge home with outpatient proiders.   Patient has transport home provided by mom.   Patient was updated by the team on the discharge plan and aware and in agreement with plan.

## 2020-09-21 NOTE — Discharge Instructions (Signed)
 Please take your medications exactly as prescribed. Do not make any changes to your medications until you speak with your provider.   Please attend all follow up appointments that have been set up for you.   If you are having thoughts of suicide or thoughts of self harm, tell someone, call 911, or go to the nearest emergency room.
